# Patient Record
Sex: Male | Born: 1950 | Race: White | Hispanic: No | Marital: Single | Smoking: Former smoker
Health system: Southern US, Community
[De-identification: ages and names within clinical notes are randomized; demographics above are authoritative.]

## PROBLEM LIST (undated history)

## (undated) DIAGNOSIS — J45909 Unspecified asthma, uncomplicated: Secondary | ICD-10-CM

## (undated) DIAGNOSIS — J449 Chronic obstructive pulmonary disease, unspecified: Secondary | ICD-10-CM

## (undated) DIAGNOSIS — F411 Generalized anxiety disorder: Secondary | ICD-10-CM

## (undated) DIAGNOSIS — Z972 Presence of dental prosthetic device (complete) (partial): Secondary | ICD-10-CM

## (undated) DIAGNOSIS — K449 Diaphragmatic hernia without obstruction or gangrene: Secondary | ICD-10-CM

## (undated) DIAGNOSIS — I10 Essential (primary) hypertension: Secondary | ICD-10-CM

## (undated) DIAGNOSIS — J302 Other seasonal allergic rhinitis: Secondary | ICD-10-CM

---

## 2009-01-11 ENCOUNTER — Inpatient Hospital Stay: Payer: Self-pay | Admitting: Internal Medicine

## 2009-02-01 ENCOUNTER — Inpatient Hospital Stay: Payer: Self-pay | Admitting: Internal Medicine

## 2009-05-26 ENCOUNTER — Ambulatory Visit: Payer: Self-pay | Admitting: Specialist

## 2009-06-22 ENCOUNTER — Ambulatory Visit: Payer: Self-pay | Admitting: Specialist

## 2009-07-27 ENCOUNTER — Emergency Department: Payer: Self-pay | Admitting: Emergency Medicine

## 2010-05-05 ENCOUNTER — Ambulatory Visit: Payer: Self-pay | Admitting: Specialist

## 2014-09-20 ENCOUNTER — Ambulatory Visit: Payer: Self-pay | Admitting: Gastroenterology

## 2014-12-14 ENCOUNTER — Encounter: Payer: Self-pay | Admitting: Family Medicine

## 2014-12-14 DIAGNOSIS — F411 Generalized anxiety disorder: Secondary | ICD-10-CM | POA: Insufficient documentation

## 2014-12-14 DIAGNOSIS — K449 Diaphragmatic hernia without obstruction or gangrene: Secondary | ICD-10-CM | POA: Insufficient documentation

## 2014-12-14 DIAGNOSIS — K219 Gastro-esophageal reflux disease without esophagitis: Secondary | ICD-10-CM | POA: Insufficient documentation

## 2014-12-14 DIAGNOSIS — J302 Other seasonal allergic rhinitis: Secondary | ICD-10-CM

## 2014-12-14 DIAGNOSIS — Z800 Family history of malignant neoplasm of digestive organs: Secondary | ICD-10-CM | POA: Insufficient documentation

## 2014-12-14 DIAGNOSIS — J449 Chronic obstructive pulmonary disease, unspecified: Secondary | ICD-10-CM | POA: Insufficient documentation

## 2014-12-14 DIAGNOSIS — F1320 Sedative, hypnotic or anxiolytic dependence, uncomplicated: Secondary | ICD-10-CM | POA: Insufficient documentation

## 2014-12-14 DIAGNOSIS — I10 Essential (primary) hypertension: Secondary | ICD-10-CM | POA: Insufficient documentation

## 2014-12-14 HISTORY — DX: Other seasonal allergic rhinitis: J30.2

## 2014-12-14 HISTORY — DX: Diaphragmatic hernia without obstruction or gangrene: K44.9

## 2014-12-14 HISTORY — DX: Generalized anxiety disorder: F41.1

## 2015-02-01 DIAGNOSIS — F419 Anxiety disorder, unspecified: Secondary | ICD-10-CM | POA: Insufficient documentation

## 2015-02-25 DIAGNOSIS — L309 Dermatitis, unspecified: Secondary | ICD-10-CM | POA: Insufficient documentation

## 2015-06-01 ENCOUNTER — Other Ambulatory Visit: Payer: Self-pay

## 2015-06-01 DIAGNOSIS — K219 Gastro-esophageal reflux disease without esophagitis: Secondary | ICD-10-CM

## 2015-06-01 MED ORDER — OMEPRAZOLE 20 MG PO CPDR: 20.0000 mg | DELAYED_RELEASE_CAPSULE | Freq: Every day | ORAL | Status: DC

## 2016-01-20 ENCOUNTER — Ambulatory Visit: Payer: Self-pay | Admitting: Family Medicine

## 2016-12-26 ENCOUNTER — Other Ambulatory Visit: Payer: Self-pay | Admitting: Otolaryngology

## 2016-12-26 DIAGNOSIS — J3489 Other specified disorders of nose and nasal sinuses: Secondary | ICD-10-CM

## 2016-12-28 ENCOUNTER — Ambulatory Visit
Admission: RE | Admit: 2016-12-28 | Discharge: 2016-12-28 | Disposition: A | Discharge status: Home / Self Care | Payer: Medicare Other | Source: Ambulatory Visit | Attending: Otolaryngology | Admitting: Otolaryngology

## 2016-12-28 DIAGNOSIS — J329 Chronic sinusitis, unspecified: Secondary | ICD-10-CM | POA: Diagnosis not present

## 2016-12-28 DIAGNOSIS — R220 Localized swelling, mass and lump, head: Secondary | ICD-10-CM | POA: Insufficient documentation

## 2016-12-28 DIAGNOSIS — J3489 Other specified disorders of nose and nasal sinuses: Secondary | ICD-10-CM

## 2016-12-28 HISTORY — DX: Unspecified asthma, uncomplicated: J45.909

## 2016-12-28 LAB — POCT I-STAT CREATININE: CREATININE: 0.8 mg/dL (ref 0.61–1.24)

## 2016-12-28 MED ORDER — IOPAMIDOL (ISOVUE-300) INJECTION 61%
75.0000 mL | Freq: Once | INTRAVENOUS | Status: AC | PRN
  Administered 2016-12-28: 75 mL via INTRAVENOUS

## 2017-01-15 DIAGNOSIS — C300 Malignant neoplasm of nasal cavity: Secondary | ICD-10-CM | POA: Insufficient documentation

## 2017-05-01 ENCOUNTER — Ambulatory Visit: Payer: Medicare Other | Admitting: Anesthesiology

## 2017-05-01 ENCOUNTER — Inpatient Hospital Stay: Payer: Medicare Other

## 2017-05-01 ENCOUNTER — Encounter: Payer: Self-pay | Admitting: *Deleted

## 2017-05-01 ENCOUNTER — Emergency Department: Payer: Medicare Other

## 2017-05-01 ENCOUNTER — Inpatient Hospital Stay
Admission: EM | Admit: 2017-05-01 | Discharge: 2017-05-03 | DRG: 394 | Disposition: A | Discharge status: Home / Self Care | Payer: Medicare Other | Attending: Internal Medicine | Admitting: Internal Medicine

## 2017-05-01 ENCOUNTER — Encounter
Admission: EM | Disposition: A | Discharge status: Home / Self Care | Payer: Self-pay | Source: Home / Self Care | Attending: Internal Medicine

## 2017-05-01 DIAGNOSIS — J449 Chronic obstructive pulmonary disease, unspecified: Secondary | ICD-10-CM | POA: Diagnosis present

## 2017-05-01 DIAGNOSIS — J45909 Unspecified asthma, uncomplicated: Secondary | ICD-10-CM | POA: Diagnosis present

## 2017-05-01 DIAGNOSIS — F418 Other specified anxiety disorders: Secondary | ICD-10-CM | POA: Diagnosis present

## 2017-05-01 DIAGNOSIS — K222 Esophageal obstruction: Secondary | ICD-10-CM | POA: Diagnosis present

## 2017-05-01 DIAGNOSIS — K210 Gastro-esophageal reflux disease with esophagitis: Secondary | ICD-10-CM | POA: Diagnosis present

## 2017-05-01 DIAGNOSIS — T18128A Food in esophagus causing other injury, initial encounter: Principal | ICD-10-CM | POA: Diagnosis present

## 2017-05-01 DIAGNOSIS — K922 Gastrointestinal hemorrhage, unspecified: Secondary | ICD-10-CM | POA: Diagnosis present

## 2017-05-01 DIAGNOSIS — D649 Anemia, unspecified: Secondary | ICD-10-CM | POA: Diagnosis present

## 2017-05-01 DIAGNOSIS — K449 Diaphragmatic hernia without obstruction or gangrene: Secondary | ICD-10-CM | POA: Diagnosis present

## 2017-05-01 DIAGNOSIS — I10 Essential (primary) hypertension: Secondary | ICD-10-CM | POA: Diagnosis present

## 2017-05-01 DIAGNOSIS — R1314 Dysphagia, pharyngoesophageal phase: Secondary | ICD-10-CM | POA: Diagnosis present

## 2017-05-01 DIAGNOSIS — I959 Hypotension, unspecified: Secondary | ICD-10-CM | POA: Diagnosis present

## 2017-05-01 DIAGNOSIS — Z7951 Long term (current) use of inhaled steroids: Secondary | ICD-10-CM | POA: Diagnosis not present

## 2017-05-01 DIAGNOSIS — K920 Hematemesis: Secondary | ICD-10-CM | POA: Diagnosis not present

## 2017-05-01 DIAGNOSIS — Z79899 Other long term (current) drug therapy: Secondary | ICD-10-CM

## 2017-05-01 DIAGNOSIS — W44F3XA Food entering into or through a natural orifice, initial encounter: Secondary | ICD-10-CM

## 2017-05-01 HISTORY — DX: Essential (primary) hypertension: I10

## 2017-05-01 HISTORY — PX: ESOPHAGOGASTRODUODENOSCOPY: SHX5428

## 2017-05-01 LAB — CBC WITH DIFFERENTIAL/PLATELET
BASOS ABS: 0 10*3/uL (ref 0–0.1)
BASOS PCT: 0 %
EOS ABS: 0 10*3/uL (ref 0–0.7)
EOS PCT: 0 %
HCT: 40.5 % (ref 40.0–52.0)
Hemoglobin: 14.3 g/dL (ref 13.0–18.0)
Lymphocytes Relative: 2 %
Lymphs Abs: 0.2 10*3/uL — ABNORMAL LOW (ref 1.0–3.6)
MCH: 30.2 pg (ref 26.0–34.0)
MCHC: 35.2 g/dL (ref 32.0–36.0)
MCV: 85.7 fL (ref 80.0–100.0)
MONO ABS: 0.5 10*3/uL (ref 0.2–1.0)
Monocytes Relative: 6 %
Neutro Abs: 7.1 10*3/uL — ABNORMAL HIGH (ref 1.4–6.5)
Neutrophils Relative %: 92 %
PLATELETS: 227 10*3/uL (ref 150–440)
RBC: 4.73 MIL/uL (ref 4.40–5.90)
RDW: 13.9 % (ref 11.5–14.5)
WBC: 7.8 10*3/uL (ref 3.8–10.6)

## 2017-05-01 LAB — BASIC METABOLIC PANEL
ANION GAP: 11 (ref 5–15)
BUN: 7 mg/dL (ref 6–20)
CALCIUM: 9.6 mg/dL (ref 8.9–10.3)
CO2: 28 mmol/L (ref 22–32)
Chloride: 95 mmol/L — ABNORMAL LOW (ref 101–111)
Creatinine, Ser: 0.65 mg/dL (ref 0.61–1.24)
GFR calc Af Amer: >60 mL/min (ref >60)
Glucose, Bld: 98 mg/dL (ref 65–99)
POTASSIUM: 4 mmol/L (ref 3.5–5.1)
SODIUM: 134 mmol/L — AB (ref 135–145)

## 2017-05-01 LAB — MRSA PCR SCREENING: MRSA by PCR: NEGATIVE

## 2017-05-01 LAB — HEMOGLOBIN AND HEMATOCRIT, BLOOD
HCT: 41.5 % (ref 40.0–52.0)
Hemoglobin: 14.2 g/dL (ref 13.0–18.0)

## 2017-05-01 LAB — GLUCOSE, CAPILLARY: GLUCOSE-CAPILLARY: 90 mg/dL (ref 65–99)

## 2017-05-01 SURGERY — EGD (ESOPHAGOGASTRODUODENOSCOPY)
Anesthesia: General

## 2017-05-01 MED ORDER — SUCCINYLCHOLINE CHLORIDE 20 MG/ML IJ SOLN
INTRAMUSCULAR | Status: DC | PRN
  Administered 2017-05-01: 100 mg via INTRAVENOUS

## 2017-05-01 MED ORDER — MIDAZOLAM HCL 2 MG/2ML IJ SOLN
INTRAMUSCULAR | Status: DC | PRN
  Administered 2017-05-01 (×2): 2 mg via INTRAVENOUS

## 2017-05-01 MED ORDER — GLUCAGON HCL (RDNA) 1 MG IJ SOLR
1.0000 mg | Freq: Once | INTRAMUSCULAR | Status: AC
  Administered 2017-05-01: 1 mg via INTRAVENOUS
  Filled 2017-05-01 (×2): qty 1

## 2017-05-01 MED ORDER — LIDOCAINE HCL (PF) 2 % IJ SOLN
INTRAMUSCULAR | Status: AC
  Filled 2017-05-01: qty 2

## 2017-05-01 MED ORDER — PANTOPRAZOLE SODIUM 40 MG IV SOLR: 40.0000 mg | Freq: Two times a day (BID) | INTRAVENOUS | Status: DC

## 2017-05-01 MED ORDER — SODIUM CHLORIDE 0.9 % IV SOLN
8.0000 mg/h | INTRAVENOUS | Status: DC
  Administered 2017-05-01 – 2017-05-03 (×3): 8 mg/h via INTRAVENOUS
  Filled 2017-05-01 (×4): qty 80

## 2017-05-01 MED ORDER — SUCCINYLCHOLINE CHLORIDE 20 MG/ML IJ SOLN
INTRAMUSCULAR | Status: AC
  Filled 2017-05-01: qty 1

## 2017-05-01 MED ORDER — ONDANSETRON HCL 4 MG/2ML IJ SOLN
4.0000 mg | Freq: Once | INTRAMUSCULAR | Status: AC
  Administered 2017-05-01: 4 mg via INTRAVENOUS
  Filled 2017-05-01: qty 2

## 2017-05-01 MED ORDER — FENTANYL CITRATE (PF) 100 MCG/2ML IJ SOLN: 25.0000 ug | INTRAMUSCULAR | Status: DC | PRN

## 2017-05-01 MED ORDER — ONDANSETRON HCL 4 MG/2ML IJ SOLN
INTRAMUSCULAR | Status: DC | PRN
  Administered 2017-05-01: 4 mg via INTRAVENOUS

## 2017-05-01 MED ORDER — LIDOCAINE HCL (CARDIAC) 20 MG/ML IV SOLN
INTRAVENOUS | Status: DC | PRN
  Administered 2017-05-01: 40 mg via INTRATRACHEAL

## 2017-05-01 MED ORDER — DEXAMETHASONE SODIUM PHOSPHATE 10 MG/ML IJ SOLN
INTRAMUSCULAR | Status: AC
  Filled 2017-05-01: qty 1

## 2017-05-01 MED ORDER — ONDANSETRON HCL 4 MG/2ML IJ SOLN: 4.0000 mg | Freq: Once | INTRAMUSCULAR | Status: DC | PRN

## 2017-05-01 MED ORDER — SODIUM CHLORIDE 0.9 % IV SOLN
INTRAVENOUS | Status: DC
  Administered 2017-05-01: 19:00:00 via INTRAVENOUS
  Administered 2017-05-02: 100 mL/h via INTRAVENOUS

## 2017-05-01 MED ORDER — MIDAZOLAM HCL 2 MG/2ML IJ SOLN
INTRAMUSCULAR | Status: AC
  Filled 2017-05-01: qty 2

## 2017-05-01 MED ORDER — SUGAMMADEX SODIUM 200 MG/2ML IV SOLN
INTRAVENOUS | Status: AC
  Filled 2017-05-01: qty 2

## 2017-05-01 MED ORDER — ROCURONIUM BROMIDE 50 MG/5ML IV SOLN
INTRAVENOUS | Status: AC
  Filled 2017-05-01: qty 1

## 2017-05-01 MED ORDER — GLUCAGON HCL RDNA (DIAGNOSTIC) 1 MG IJ SOLR
INTRAMUSCULAR | Status: AC
  Administered 2017-05-01: 1 mg via INTRAVENOUS
  Filled 2017-05-01: qty 1

## 2017-05-01 MED ORDER — DEXAMETHASONE SODIUM PHOSPHATE 10 MG/ML IJ SOLN
INTRAMUSCULAR | Status: DC | PRN
  Administered 2017-05-01: 10 mg via INTRAVENOUS

## 2017-05-01 MED ORDER — SUGAMMADEX SODIUM 200 MG/2ML IV SOLN
INTRAVENOUS | Status: DC | PRN
  Administered 2017-05-01: 150 mg via INTRAVENOUS

## 2017-05-01 MED ORDER — SODIUM CHLORIDE 0.9 % IV SOLN
INTRAVENOUS | Status: DC
  Administered 2017-05-01 (×2): via INTRAVENOUS

## 2017-05-01 MED ORDER — SODIUM CHLORIDE 0.9 % IV BOLUS (SEPSIS)
500.0000 mL | Freq: Once | INTRAVENOUS | Status: AC
  Administered 2017-05-01: 500 mL via INTRAVENOUS

## 2017-05-01 MED ORDER — ONDANSETRON HCL 4 MG/2ML IJ SOLN
INTRAMUSCULAR | Status: AC
  Filled 2017-05-01: qty 2

## 2017-05-01 MED ORDER — FENTANYL CITRATE (PF) 100 MCG/2ML IJ SOLN
INTRAMUSCULAR | Status: DC | PRN
  Administered 2017-05-01: 100 ug via INTRAVENOUS

## 2017-05-01 MED ORDER — SODIUM CHLORIDE 0.9 % IV SOLN
80.0000 mg | Freq: Once | INTRAVENOUS | Status: AC
  Administered 2017-05-01: 19:00:00 80 mg via INTRAVENOUS
  Filled 2017-05-01: qty 80

## 2017-05-01 MED ORDER — FENTANYL CITRATE (PF) 100 MCG/2ML IJ SOLN
25.0000 ug | INTRAMUSCULAR | Status: DC | PRN
  Administered 2017-05-01: 25 ug via INTRAVENOUS
  Filled 2017-05-01: qty 2

## 2017-05-01 MED ORDER — PROPOFOL 10 MG/ML IV BOLUS
INTRAVENOUS | Status: DC | PRN
  Administered 2017-05-01: 50 mg via INTRAVENOUS
  Administered 2017-05-01: 150 mg via INTRAVENOUS

## 2017-05-01 MED ORDER — FENTANYL CITRATE (PF) 100 MCG/2ML IJ SOLN
INTRAMUSCULAR | Status: AC
  Filled 2017-05-01: qty 2

## 2017-05-01 MED ORDER — ROCURONIUM BROMIDE 100 MG/10ML IV SOLN
INTRAVENOUS | Status: DC | PRN
  Administered 2017-05-01 (×2): 20 mg via INTRAVENOUS

## 2017-05-01 MED ORDER — PROPOFOL 10 MG/ML IV BOLUS
INTRAVENOUS | Status: AC
  Filled 2017-05-01: qty 20

## 2017-05-01 NOTE — Anesthesia Post-op Follow-up Note (Signed)
Anesthesia QCDR form completed.        

## 2017-05-01 NOTE — Anesthesia Postprocedure Evaluation (Signed)
Anesthesia Post Note  Patient: Aaron Walton  Procedure(s) Performed: Procedure(s) (LRB): ESOPHAGOGASTRODUODENOSCOPY (EGD) (N/A)  Patient location during evaluation: PACU Anesthesia Type: General Level of consciousness: awake and alert Pain management: pain level controlled Vital Signs Assessment: post-procedure vital signs reviewed and stable Respiratory status: spontaneous breathing, nonlabored ventilation, respiratory function stable and patient connected to nasal cannula oxygen Cardiovascular status: blood pressure returned to baseline and stable Postop Assessment: no signs of nausea or vomiting Anesthetic complications: no     Last Vitals:  Vitals:   05/01/17 1900 05/01/17 2000  BP:    Pulse: 72 79  Resp: 13 13  Temp:  36.9 C  SpO2: 99% 98%    Last Pain:  Vitals:   05/01/17 2000  TempSrc: Oral  PainSc: 0-No pain                 Martha Clan

## 2017-05-01 NOTE — ED Triage Notes (Signed)
States while eating ham for lunch he got a piece stuck in his throat, states some burning in his throat and coughing up some blood, states he can not swallow water, states hx of esophogeal stretching, pt in no acute distress

## 2017-05-01 NOTE — H&P (Signed)
Aaron Walton is an 66 y.o. male.   Chief Complaint: choking HPI: this is 66 year old male who has a history of esophageal stricture in the past. Today he was eating some ham and got a piece stuck in his throat. He came into the ER was taking to endoscopy where the food obstruction was removed esophagus was dilated some. During the procedure the GI hysician notice some blood oozing from the wall of esophagitis and a clot. Concerned that point was possible tear versus perforation of the esophagus. Hospitalist services were consulted for admission. Patient was to get a CT scan to rule this out. Currently the patient is extubated and has no complaints right now said he feels a lot better. No chest pain no shortness of breath.  Past Medical History:  Diagnosis Date  . Asthma   . Hypertension     History reviewed. No pertinent surgical history.  History reviewed. No pertinent family history. Social History:  reports that he has never smoked. He has never used smokeless tobacco. He reports that he does not drink alcohol or use drugs.  Allergies: No Known Allergies  Medications Prior to Admission  Medication Sig Dispense Refill  . ALPRAZolam (XANAX) 1 MG tablet Take 1 tablet by mouth 3 (three) times daily as needed.    . clonazePAM (KLONOPIN) 1 MG tablet Take 1 mg by mouth 2 (two) times daily.    . Fluticasone-Salmeterol (ADVAIR) 250-50 MCG/DOSE AEPB Inhale 2 puffs into the lungs 2 (two) times daily.    Marland Kitchen omeprazole (PRILOSEC) 20 MG capsule Take 1 capsule (20 mg total) by mouth at bedtime. 30 capsule 11  . quinapril-hydrochlorothiazide (ACCURETIC) 20-12.5 MG per tablet Take 2 tablets by mouth daily.    . sertraline (ZOLOFT) 50 MG tablet Take 50 mg by mouth daily.    Marland Kitchen tiotropium (SPIRIVA) 18 MCG inhalation capsule Place 1 capsule into inhaler and inhale daily.    Marland Kitchen albuterol (PROVENTIL HFA;VENTOLIN HFA) 108 (90 BASE) MCG/ACT inhaler Inhale 1 puff into the lungs every 6 (six) hours as needed.     . cetirizine (ZYRTEC) 10 MG tablet Take 1 tablet by mouth daily.    . furosemide (LASIX) 20 MG tablet Take 1 tablet by mouth daily.      Results for orders placed or performed during the hospital encounter of 05/01/17 (from the past 48 hour(s))  CBC with Differential/Platelet     Status: Abnormal   Collection Time: 05/01/17  2:24 PM  Result Value Ref Range   WBC 7.8 3.8 - 10.6 K/uL   RBC 4.73 4.40 - 5.90 MIL/uL   Hemoglobin 14.3 13.0 - 18.0 g/dL   HCT 40.5 40.0 - 52.0 %   MCV 85.7 80.0 - 100.0 fL   MCH 30.2 26.0 - 34.0 pg   MCHC 35.2 32.0 - 36.0 g/dL   RDW 13.9 11.5 - 14.5 %   Platelets 227 150 - 440 K/uL   Neutrophils Relative % 92 %   Neutro Abs 7.1 (H) 1.4 - 6.5 K/uL   Lymphocytes Relative 2 %   Lymphs Abs 0.2 (L) 1.0 - 3.6 K/uL   Monocytes Relative 6 %   Monocytes Absolute 0.5 0.2 - 1.0 K/uL   Eosinophils Relative 0 %   Eosinophils Absolute 0.0 0 - 0.7 K/uL   Basophils Relative 0 %   Basophils Absolute 0.0 0 - 0.1 K/uL  Basic metabolic panel     Status: Abnormal   Collection Time: 05/01/17  2:24 PM  Result Value Ref  Range   Sodium 134 (L) 135 - 145 mmol/L   Potassium 4.0 3.5 - 5.1 mmol/L   Chloride 95 (L) 101 - 111 mmol/L   CO2 28 22 - 32 mmol/L   Glucose, Bld 98 65 - 99 mg/dL   BUN 7 6 - 20 mg/dL   Creatinine, Ser 0.65 0.61 - 1.24 mg/dL   Calcium 9.6 8.9 - 10.3 mg/dL   GFR calc non Af Amer >60 >60 mL/min   GFR calc Af Amer >60 >60 mL/min    Comment: (NOTE) The eGFR has been calculated using the CKD EPI equation. This calculation has not been validated in all clinical situations. eGFR's persistently <60 mL/min signify possible Chronic Kidney Disease.    Anion gap 11 5 - 15   Dg Chest Portable 1 View  Result Date: 05/01/2017 CLINICAL DATA:  Cough, throat burning after getting ham stuck in throat during lunch. CT cannot swallow. History of esophageal stretching. EXAM: PORTABLE CHEST 1 VIEW COMPARISON:  CT chest May 05, 2010 FINDINGS: Cardiac silhouette is  normal in size. Mildly tortuous aorta associated with hypertension. No pleural effusion or focal consolidation. No radiopaque foreign bodies. No pneumothorax. Old RIGHT rib fractures. Moderate hiatal hernia. IMPRESSION: No acute cardiopulmonary process. Moderate hiatal hernia. Electronically Signed   By: Elon Alas M.D.   On: 05/01/2017 14:58    Review of Systems  Constitutional: Negative for fever.  HENT: Negative for hearing loss.   Eyes: Negative for blurred vision.  Respiratory: Negative for cough.   Cardiovascular: Negative for chest pain.  Gastrointestinal: Positive for heartburn.  Genitourinary: Negative for dysuria.  Musculoskeletal: Negative for myalgias.  Skin: Negative for rash.  Neurological: Negative for dizziness.    Blood pressure 135/78, pulse 77, temperature 98.5 F (36.9 C), resp. rate 12, height 6' (1.829 m), weight 78.5 kg (173 lb), SpO2 99 %. Physical Exam  Constitutional: He is oriented to person, place, and time. He appears well-developed and well-nourished. No distress.  HENT:  Head: Normocephalic and atraumatic.  Mouth/Throat: Oropharynx is clear and moist. No oropharyngeal exudate.  Eyes: Pupils are equal, round, and reactive to light. No scleral icterus.  Neck: Normal range of motion. Neck supple. No JVD present. No thyromegaly present.  Cardiovascular: Normal rate and regular rhythm.   No murmur heard. Respiratory: Effort normal and breath sounds normal. No respiratory distress. He exhibits no tenderness.  GI: Soft. Bowel sounds are normal. He exhibits no distension and no mass.  Musculoskeletal: He exhibits no edema or tenderness.  Neurological: He is alert and oriented to person, place, and time. No cranial nerve deficit.  Skin: Skin is warm and dry.     Assessment/Plan 1.GI bleeding. Since upper GI from likely a tear in the esophagus.based on his symptoms and physical exam currently have lower suspicion of esophageal perforation. However CT  scan with contrast is pending to rule this out totally.We'll go ahead and put him on a PPI. Recheck hemoglobin. If CT comes back showing a tear then will get a stat surgical consult at that point. For now he is going to the stepdown unit for close observation. 2. Esophageal stricture. He status post dilatation. He also had a food obstruction which has been removed. 3. COPD. We'll continue his inhalers. 4. Hypertension. We'll restart his medicines when he is able to take by mouth.  Total time spent 45 minutes  Baxter Hire, MD 05/01/2017, 6:31 PM

## 2017-05-01 NOTE — Consult Note (Signed)
Name: Aaron Walton MRN: [id number] DOB: [birth year]    ADMISSION DATE:  05/01/2017 CONSULTATION DATE: 05/01/2017  REFERRING MD : Dr. Edwina Barth   CHIEF COMPLAINT: s/p EGD due to food impaction   BRIEF PATIENT DESCRIPTION:  66 yo old male admitted 09/5 s/p EGD due to food impaction extubated post EGD concern for possible esophageal perforation   SIGNIFICANT EVENTS  09/5-Pt admitted to stepdown unit s/p EGD  STUDIES:  Upper Endoscopy 09/5>>Food at the gastroesophageal junction. Removal was successful. GE junction stricture Mucosal tear in the lower third of the esophagus. Normal stomach. Normal duodenal bulb, second portion of the duodenum and third portion of the duodenum.  HISTORY OF PRESENT ILLNESS:   This is a 66 yo male with a PMH of HTN, Esophageal Stretching, and Asthma.  He presented to [facility] ER 09/5 from home with c/o burning of the throat, difficulty swallowing water, and coughing up blood after getting choked during lunch while eating ham.  Per GI notes he had a similar episode 2-3 years ago and underwent an EGD followed by dilation.  Over the past several months he has had intermittent episodes with difficulty swallowing solids.  Due to symptoms gastroenterology consulted and recommended the pt receive an upper endoscopy.  The pt was mechanically intubated prior to upper endoscopy.  EGD results revealed food at the gastroesophageal junction that was successfully removed during endoscopy and a pre-existing bleeding mucosal tear with overlying blood clot was present in the lower third of the esophagus without obvious perforation.  The pt was successfully extubated post intubation and admitted by hospitalist team to the stepdown unit for further workup and treatment PCCM consulted.  PAST MEDICAL HISTORY :   has a past medical history of Asthma and Hypertension.  has no past surgical history on file. Prior to Admission medications   Medication Sig Start Date End Date Taking?  Authorizing Provider  ALPRAZolam Duanne Moron) 1 MG tablet Take 1 tablet by mouth 3 (three) times daily as needed.   Yes [provider]  clonazePAM (KLONOPIN) 1 MG tablet Take 1 mg by mouth 2 (two) times daily.   Yes [provider]  Fluticasone-Salmeterol (ADVAIR) 250-50 MCG/DOSE AEPB Inhale 2 puffs into the lungs 2 (two) times daily.   Yes [provider]  omeprazole (PRILOSEC) 20 MG capsule Take 1 capsule (20 mg total) by mouth at bedtime. 06/01/15  Yes Lucilla Lame, MD  quinapril-hydrochlorothiazide (ACCURETIC) 20-12.5 MG per tablet Take 2 tablets by mouth daily.   Yes [provider]  sertraline (ZOLOFT) 50 MG tablet Take 50 mg by mouth daily.   Yes [provider]  tiotropium (SPIRIVA) 18 MCG inhalation capsule Place 1 capsule into inhaler and inhale daily.   Yes [provider]  albuterol (PROVENTIL HFA;VENTOLIN HFA) 108 (90 BASE) MCG/ACT inhaler Inhale 1 puff into the lungs every 6 (six) hours as needed.    [provider]  cetirizine (ZYRTEC) 10 MG tablet Take 1 tablet by mouth daily.    [provider]  furosemide (LASIX) 20 MG tablet Take 1 tablet by mouth daily.    [provider]   No Known Allergies  FAMILY HISTORY:  family history is not on file. SOCIAL HISTORY:  reports that he has never smoked. He has never used smokeless tobacco. He reports that he does not drink alcohol or use drugs.  REVIEW OF SYSTEMS: Positives in BOLD  Constitutional: Negative for fever, chills, weight loss, malaise/fatigue and diaphoresis.  HENT: hearing loss, ear pain,  nosebleeds, congestion, sore throat, neck pain, tinnitus and ear discharge.   Eyes: Negative for blurred vision, double vision, photophobia, pain, discharge and redness.  Respiratory: Negative for cough, hemoptysis, sputum production, shortness of breath, wheezing and stridor.   Cardiovascular: Negative for chest pain, palpitations, orthopnea, claudication, leg  swelling and PND.  Gastrointestinal: heartburn, nausea, vomiting, abdominal pain, diarrhea, constipation, blood in stool and melena.  Genitourinary: Negative for dysuria, urgency, frequency, hematuria and flank pain.  Musculoskeletal: Negative for myalgias, back pain, joint pain and falls.  Skin: Negative for itching and rash.  Neurological: Negative for dizziness, tingling, tremors, sensory change, speech change, focal weakness, seizures, loss of consciousness, weakness and headaches.  Endo/Heme/Allergies: Negative for environmental allergies and polydipsia. Does not bruise/bleed easily.  SUBJECTIVE:  No complaints at this time.  VITAL SIGNS: Temp:  [98 F (36.7 C)-98.7 F (37.1 C)] 98 F (36.7 C) (09/05 1745) Pulse Rate:  [77-95] 84 (09/05 1800) Resp:  [11-18] 11 (09/05 1800) BP: (131-147)/(77-98) 131/77 (09/05 1800) SpO2:  [98 %-100 %] 100 % (09/05 1800) Weight:  [78.5 kg (173 lb)] 78.5 kg (173 lb) (09/05 1545)  PHYSICAL EXAMINATION: General: well developed, well nourished, Caucasian male, NAD Neuro: alert and oriented, follows commands  HEENT: supple, no JVD Cardiovascular: s1s2, RRR, no M/R/G Lungs: clear throughout, even, non labored  Abdomen: +BS x4, soft, non tender, non distended  Musculoskeletal: normal bulk and tone, no edema  Skin: intact no rashes or lesions    Recent Labs Lab 05/01/17 1424  NA 134*  K 4.0  CL 95*  CO2 28  BUN 7  CREATININE 0.65  GLUCOSE 98    Recent Labs Lab 05/01/17 1424  HGB 14.3  HCT 40.5  WBC 7.8  PLT 227   Dg Chest Portable 1 View  Result Date: 05/01/2017 CLINICAL DATA:  Cough, throat burning after getting ham stuck in throat during lunch. CT cannot swallow. History of esophageal stretching. EXAM: PORTABLE CHEST 1 VIEW COMPARISON:  CT chest May 05, 2010 FINDINGS: Cardiac silhouette is normal in size. Mildly tortuous aorta associated with hypertension. No pleural effusion or focal consolidation. No radiopaque foreign  bodies. No pneumothorax. Old RIGHT rib fractures. Moderate hiatal hernia. IMPRESSION: No acute cardiopulmonary process. Moderate hiatal hernia. Electronically Signed   By: Elon Alas M.D.   On: 05/01/2017 14:58    ASSESSMENT / PLAN: s/p EGD due to food impaction in the gastroesophageal junction  Bleeding in the oropharynx and entire esophagus secondary to food impaction Hx: Asthma and HTN P: Supplemental O2 to maintain O2 sats >92% or for dyspnea  Prn CXR CT Chest pending to assess for possible esophageal perforation  Gastroenterology consulted appreciate input Serial H&H for the next 24hrs Monitor for s/sx of bleeding Transfuse for active bleeding and/or hgb <7 SCD's for VTE prophylaxis, avoid chemical prophylaxis  NS@ 100 ml/hr  Maintain map >65 Aspiration precautions  Prn zofran for nausea/vomiting   Marda Stalker, [place] Pager [phone] (please enter 7 digits) PCCM Consult Pager [phone] (please enter 7 digits)

## 2017-05-01 NOTE — Progress Notes (Signed)
eLink Physician-Brief Progress Note Patient Name: Aaron Walton DOB: [birth year] MRN: [id number]   Date of Service  05/01/2017  HPI/Events of Note  66 yo male with PMH of COPD, HTN and esophageal food impaction. Now s/p EGD for esophageal food impaction. Remains intubated and ventilated. Concern for esophageal perforation. CT Scan pending. PCCM consulted to assume care in ICU. VSS.   eICU Interventions  No new orders.      Intervention Category Evaluation Type: New Patient Evaluation  Lysle Dingwall 05/01/2017, 6:55 PM

## 2017-05-01 NOTE — Anesthesia Preprocedure Evaluation (Addendum)
Anesthesia Evaluation  Patient identified by MRN, date of birth, ID band Patient awake    Reviewed: Allergy & Precautions, H&P , NPO status , Patient's Chart, lab work & pertinent test results, reviewed documented beta blocker date and time   History of Anesthesia Complications Negative for: history of anesthetic complications  Airway Mallampati: I  TM Distance: >3 FB Neck ROM: full    Dental  (+) Edentulous Upper, Upper Dentures, Partial Lower, Missing, Dental Advidsory Given   Pulmonary neg shortness of breath, asthma , neg sleep apnea, COPD,  COPD inhaler, neg recent URI,           Cardiovascular Exercise Tolerance: Good hypertension, (-) angina(-) CAD, (-) Past MI, (-) Cardiac Stents and (-) CABG (-) dysrhythmias (-) Valvular Problems/Murmurs     Neuro/Psych PSYCHIATRIC DISORDERS (Anxiety) negative neurological ROS     GI/Hepatic Neg liver ROS, hiatal hernia, GERD  ,  Endo/Other  negative endocrine ROS  Renal/GU negative Renal ROS  negative genitourinary   Musculoskeletal   Abdominal   Peds  Hematology negative hematology ROS (+)   Anesthesia Other Findings Past Medical History: No date: Asthma No date: Hypertension   Reproductive/Obstetrics negative OB ROS                             Anesthesia Physical Anesthesia Plan  ASA: II  Anesthesia Plan: General   Post-op Pain Management:    Induction: Intravenous, Rapid sequence and Cricoid pressure planned  PONV Risk Score and Plan: 2 and Ondansetron and Dexamethasone  Airway Management Planned: Oral ETT  Additional Equipment:   Intra-op Plan:   Post-operative Plan: Extubation in OR  Informed Consent: I have reviewed the patients History and Physical, chart, labs and discussed the procedure including the risks, benefits and alternatives for the proposed anesthesia with the patient or authorized representative who has indicated  his/her understanding and acceptance.   Dental Advisory Given  Plan Discussed with: Anesthesiologist, CRNA and Surgeon  Anesthesia Plan Comments:        Anesthesia Quick Evaluation

## 2017-05-01 NOTE — Op Note (Signed)
[facility] Gastroenterology Patient Name: Aaron Walton Procedure Date: 05/01/2017 4:29 PM MRN: [id number] Account #: [id number] Date of Birth: [birth year] Admit Type: Emergency Department Age: 66 Room: [facility] ENDO ROOM 3 Gender: Male Note Status: Finalized Procedure:            Upper GI endoscopy Indications:          Foreign body in the esophagus, Esophageal dysphagia Providers:            Lin Landsman MD, MD Referring MD:         [facility] pratice [place], MD (Referring MD) Medicines:            General Anesthesia Complications:        No immediate complications. Estimated blood loss:                        Minimal. Procedure:            Pre-Anesthesia Assessment:                       - Prior to the procedure, a History and Physical was                        performed, and patient medications and allergies were                        reviewed. The patient is competent. The risks and                        benefits of the procedure and the sedation options and                        risks were discussed with the patient. All questions                        were answered and informed consent was obtained.                        Patient identification and proposed procedure were                        verified by the physician, the nurse, the                        anesthesiologist, the anesthetist and the technician in                        the pre-procedure area in the procedure room. Mental                        Status Examination: alert and oriented. Airway                        Examination: normal oropharyngeal airway and neck                        mobility. Respiratory Examination: clear to                        auscultation. CV Examination: normal. Prophylactic  Antibiotics: The patient does not require prophylactic                        antibiotics. Prior Anticoagulants: The patient has                        taken no  previous anticoagulant or antiplatelet agents.                        ASA Grade Assessment: II - A patient with mild systemic                        disease. After reviewing the risks and benefits, the                        patient was deemed in satisfactory condition to undergo                        the procedure. The anesthesia plan was to use general                        anesthesia. Immediately prior to administration of                        medications, the patient was re-assessed for adequacy                        to receive sedatives. The heart rate, respiratory rate,                        oxygen saturations, blood pressure, adequacy of                        pulmonary ventilation, and response to care were                        monitored throughout the procedure. The physical status                        of the patient was re-assessed after the procedure.                       After obtaining informed consent, the endoscope was                        passed under direct vision. Throughout the procedure,                        the patient's blood pressure, pulse, and oxygen                        saturations were monitored continuously. The Endoscope                        was introduced through the mouth, and advanced to the                        second part of duodenum. The upper GI endoscopy was  accomplished without difficulty. The patient tolerated                        the procedure well. Findings:      Food bolus was found at the gastroesophageal junction. Removal of food       was accomplished.      A bleeding mucosal tear with overlying blood clot that was pre-existing       (pre-procedure) was found in the lower third of the esophagus, probably       secondary to severe retching from food impaction. No obvious perforation       was identified      The entire examined stomach was normal.      The duodenal bulb, second portion of the  duodenum and third portion of       the duodenum were normal.      One severe benign-appearing, intrinsic stenosis was found 38 cm from the       incisors. And scope was traversed after food bolus removal. Impression:           - Food at the gastroesophageal junction. Removal was                        successful.                       - GE junction stricture                       - Mucosal tear in the lower third of the esophagus.                       - Normal stomach.                       - Normal duodenal bulb, second portion of the duodenum                        and third portion of the duodenum. Recommendation:       - Admit the patient to ICU for observation.                       - NPO.                       - Use a proton pump inhibitor IV.                       - Perform CT scan (computed tomography) of the chest                        today to r/o esophageal perforation.                       - Repeat upper endoscopy at appointment to be scheduled                        for retreatment.                       - Return to GI clinic at appointment to be scheduled. Procedure Code(s):    --- Professional ---  [phone], Esophagogastroduodenoscopy, flexible, transoral;                        with removal of foreign body(s) Diagnosis Code(s):    --- Professional ---                       O96.295M, Food in esophagus causing other injury,                        initial encounter                       T18.108A, Unspecified foreign body in esophagus causing                        other injury, initial encounter                       R13.14, Dysphagia, pharyngoesophageal phase CPT copyright 2016 [employer]. All rights reserved. The codes documented in this report are preliminary and upon coder review may  be revised to meet current compliance requirements. Dr. Ulyess Mort Lin Landsman MD, MD 05/01/2017 5:55:58 PM This report has been signed  electronically. Number of Addenda: 0 Note Initiated On: 05/01/2017 4:29 PM      [facility]

## 2017-05-01 NOTE — ED Provider Notes (Signed)
[facility] Emergency Department Provider Note    First MD Initiated Contact with Patient 05/01/17 1428     (approximate)  I have reviewed the triage vital signs and the nursing notes.   HISTORY  Chief Complaint Swallowed Foreign Body    HPI Aaron Walton is a 66 y.o. male with a history of esophageal stricture and food bolus impaction presents withchief complaint of odynophagia started around noon after he was eating ham. Since then he has not been able to tolerate any of water. Denies any shortness of breath is able to speak in complete sentences but has had several episodes of small-volume emesis some with blood in it. Patient is frequent tried drinking water but is unable to keep it down. Denies any other symptoms at this time.   Past Medical History:  Diagnosis Date  . Asthma   . Hypertension    History reviewed. No pertinent family history. History reviewed. No pertinent surgical history. Patient Active Problem List   Diagnosis Date Noted  . GI bleed 05/01/2017  . Food impaction of esophagus   . Benzodiazepine dependence ([place]) 12/14/2014  . COPD, moderate ([place]) 12/14/2014  . Essential (primary) hypertension 12/14/2014  . Family history of colon cancer 12/14/2014  . Gastro-esophageal reflux disease without esophagitis 12/14/2014  . Anxiety, generalized 12/14/2014  . 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 Allergic rhinitis, seasonal 12/14/2014      Prior to Admission medications   Medication Sig Start Date End Date Taking? Authorizing Provider  ALPRAZolam Duanne Moron) 1 MG tablet Take 1 tablet by mouth 3 (three) times daily as needed.   Yes [provider]  clonazePAM (KLONOPIN) 1 MG tablet Take 1 mg by mouth 2 (two) times daily.   Yes [provider]  Fluticasone-Salmeterol (ADVAIR) 250-50 MCG/DOSE AEPB Inhale 2 puffs into the lungs 2 (two) times daily.   Yes [provider]  omeprazole (PRILOSEC) 20 MG capsule Take 1  capsule (20 mg total) by mouth at bedtime. 06/01/15  Yes Lucilla Lame, MD  quinapril-hydrochlorothiazide (ACCURETIC) 20-12.5 MG per tablet Take 2 tablets by mouth daily.   Yes [provider]  sertraline (ZOLOFT) 50 MG tablet Take 50 mg by mouth daily.   Yes [provider]  tiotropium (SPIRIVA) 18 MCG inhalation capsule Place 1 capsule into inhaler and inhale daily.   Yes [provider]  albuterol (PROVENTIL HFA;VENTOLIN HFA) 108 (90 BASE) MCG/ACT inhaler Inhale 1 puff into the lungs every 6 (six) hours as needed.    [provider]  cetirizine (ZYRTEC) 10 MG tablet Take 1 tablet by mouth daily.    [provider]  furosemide (LASIX) 20 MG tablet Take 1 tablet by mouth daily.    [provider]    Allergies Patient has no known allergies.    Social History Social History  Substance Use Topics  . Smoking status: Never Smoker  . Smokeless tobacco: Never Used  . Alcohol use No    Review of Systems Patient denies headaches, rhinorrhea, blurry vision, numbness, shortness of breath, chest pain, edema, cough, abdominal pain, nausea, vomiting, diarrhea, dysuria, fevers, rashes or hallucinations unless otherwise stated above in HPI. ____________________________________________   PHYSICAL EXAM:  VITAL SIGNS: Vitals:   05/01/17 1745 05/01/17 1800  BP: 134/84 131/77  Pulse: 90 84  Resp: 16 11  Temp: 98 F (36.7 C)   SpO2: 100% 100%    Constitutional: Alert and oriented. Well appearing and in no acute distress. Eyes: Conjunctivae are normal.  Head: Atraumatic. Nose: No congestion/rhinnorhea. Mouth/Throat: Mucous membranes are moist.   Neck: No stridor. Painless ROM.  Cardiovascular: Normal rate, regular rhythm. Grossly normal heart sounds.  Good peripheral circulation. Respiratory: Normal respiratory effort.  No retractions. Lungs CTAB. Gastrointestinal: Soft and nontender. No distention. No abdominal bruits. No CVA  tenderness. Genitourinary:  Musculoskeletal: No lower extremity tenderness nor edema.  No joint effusions. Neurologic:  Normal speech and language. No gross focal neurologic deficits are appreciated. No facial droop Skin:  Skin is warm, dry and intact. No rash noted. Psychiatric: Mood and affect are normal. Speech and behavior are normal.  ____________________________________________   LABS (all labs ordered are listed, but only abnormal results are displayed)  Results for orders placed or performed during the hospital encounter of 05/01/17 (from the past 24 hour(s))  CBC with Differential/Platelet     Status: Abnormal   Collection Time: 05/01/17  2:24 PM  Result Value Ref Range   WBC 7.8 3.8 - 10.6 K/uL   RBC 4.73 4.40 - 5.90 MIL/uL   Hemoglobin 14.3 13.0 - 18.0 g/dL   HCT 40.5 40.0 - 52.0 %   MCV 85.7 80.0 - 100.0 fL   MCH 30.2 26.0 - 34.0 pg   MCHC 35.2 32.0 - 36.0 g/dL   RDW 13.9 11.5 - 14.5 %   Platelets 227 150 - 440 K/uL   Neutrophils Relative % 92 %   Neutro Abs 7.1 (H) 1.4 - 6.5 K/uL   Lymphocytes Relative 2 %   Lymphs Abs 0.2 (L) 1.0 - 3.6 K/uL   Monocytes Relative 6 %   Monocytes Absolute 0.5 0.2 - 1.0 K/uL   Eosinophils Relative 0 %   Eosinophils Absolute 0.0 0 - 0.7 K/uL   Basophils Relative 0 %   Basophils Absolute 0.0 0 - 0.1 K/uL  Basic metabolic panel     Status: Abnormal   Collection Time: 05/01/17  2:24 PM  Result Value Ref Range   Sodium 134 (L) 135 - 145 mmol/L   Potassium 4.0 3.5 - 5.1 mmol/L   Chloride 95 (L) 101 - 111 mmol/L   CO2 28 22 - 32 mmol/L   Glucose, Bld 98 65 - 99 mg/dL   BUN 7 6 - 20 mg/dL   Creatinine, Ser 0.65 0.61 - 1.24 mg/dL   Calcium 9.6 8.9 - 10.3 mg/dL   GFR calc non Af Amer >60 >60 mL/min   GFR calc Af Amer >60 >60 mL/min   Anion gap 11 5 - 15   ____________________________________________  EKG My review and personal interpretation at Time:  14:26   Indication: chest pain  Rate: 90  Rhythm: sinus Axis: normal Other:  normal intervals, no stemi, no depressions ____________________________________________  RADIOLOGY  I personally reviewed all radiographic images ordered to evaluate for the above acute complaints and reviewed radiology reports and findings.  These findings were personally discussed with the patient.  Please see medical record for radiology report.  ____________________________________________   PROCEDURES  Procedure(s) performed:  Procedures    Critical Care performed: no ____________________________________________   INITIAL IMPRESSION / ASSESSMENT AND PLAN / ED COURSE  Pertinent labs & imaging results that were available during my care of the patient were reviewed by me and considered in my medical decision making (see chart for details).  DDX: food bolus impaction, esophagitis, perforation, boerhaaves, mallory weiss  Aaron Walton is a 66 y.o. who presents to the ED with evidence of probable food bolus impaction by history and exam. We will  attempt IV glucagon but as he has had this in the past requiring endoscopy we'll go ahead and touch base with gastroenterology as I do suspect the patient will require endoscopy. Less consistent with perforation. No evidence of pneumomediastinum on his chest x-ray.  Clinical Course as of May 01 1813  Wed May 01, 2017  1502 I spoke with Dr. Marius Ditch of GI who agrees to take patient to endo sweet for further evaluation and management.  [PR]    Clinical Course User Index [PR] Merlyn Lot, MD     ____________________________________________   FINAL CLINICAL IMPRESSION(S) / ED DIAGNOSES  Final diagnoses:  Food impaction of esophagus, initial encounter      NEW MEDICATIONS STARTED DURING THIS VISIT:  Current Discharge Medication List       Note:  This document was prepared using Dragon voice recognition software and may include unintentional dictation errors.    Merlyn Lot, MD 05/01/17 [phone]

## 2017-05-01 NOTE — Transfer of Care (Signed)
Immediate Anesthesia Transfer of Care Note  Patient: Aaron Walton  Procedure(s) Performed: Procedure(s): ESOPHAGOGASTRODUODENOSCOPY (EGD) (N/A)  Patient Location: PACU  Anesthesia Type:General  Level of Consciousness: awake and patient cooperative  Airway & Oxygen Therapy: Patient Spontanous Breathing and Patient connected to face mask oxygen  Post-op Assessment: Report given to RN and Post -op Vital signs reviewed and stable  Post vital signs: Reviewed and stable  Last Vitals:  Vitals:   05/01/17 1545 05/01/17 1745  BP: (!) 147/89 134/84  Pulse: 77 90  Resp: 18 16  Temp: 37.1 C 36.7 C  SpO2: 100% 100%    Last Pain:  Vitals:   05/01/17 1545  TempSrc: Tympanic  PainSc: 7          Complications: No apparent anesthesia complications

## 2017-05-01 NOTE — Progress Notes (Signed)
Patient taken down to CT with orderly and RN for scan of chest at 2000. Returned to unit at this time.

## 2017-05-01 NOTE — Consult Note (Signed)
[name], MD [address]  [place]  [place], [place] [address]  Main: [phone]  Fax: [phone] Pager: [phone]   Consultation  Referring Provider:     No ref. provider found Primary Care Physician:  System, Pcp Not In Primary Gastroenterologist: None         Reason for Consultation:     Food impaction  Date of Admission:  05/01/2017 Date of Consultation:  05/01/2017         HPI:   Aaron Walton is a 66 y.o. male with history of chronic GERD, on PPI as outpatient presents with food impaction, after eating ham for lunch. He felt like something stuck in her throat. He reports having similar episode about 2-3 years ago, underwent EGD followed by dilation. Over the last several months, he reports intermittent episodes of difficulty swallowing solids. He has been taking omeprazole 40 mg once a day. He denies having an outpatient gastroenterologist as he was feeling better. He does not smoke, denies taking NSAIDs.  GI Procedures: EGD for food impaction followed by dilatation in the past  Past Medical History:  Diagnosis Date  . Asthma   . Hypertension     History reviewed. No pertinent surgical history.  Prior to Admission medications   Medication Sig Start Date End Date Taking? Authorizing Provider  albuterol (PROVENTIL HFA;VENTOLIN HFA) 108 (90 BASE) MCG/ACT inhaler Inhale 1 puff into the lungs every 6 (six) hours as needed.    [provider]  ALPRAZolam Duanne Moron) 1 MG tablet Take 1 tablet by mouth 3 (three) times daily as needed.    [provider]  cetirizine (ZYRTEC) 10 MG tablet Take 1 tablet by mouth daily.    [provider]  Fluticasone-Salmeterol (ADVAIR) 250-50 MCG/DOSE AEPB Inhale 2 puffs into the lungs 2 (two) times daily.    [provider]  furosemide (LASIX) 20 MG tablet Take 1 tablet by mouth daily.    [provider]  omeprazole (PRILOSEC) 20 MG capsule Take 1 capsule (20 mg total) by mouth at  bedtime. 06/01/15   Lucilla Lame, MD  quinapril-hydrochlorothiazide (ACCURETIC) 20-12.5 MG per tablet Take 2 tablets by mouth daily.    [provider]  tiotropium (SPIRIVA) 18 MCG inhalation capsule Place 1 capsule into inhaler and inhale daily.    [provider]    History reviewed. No pertinent family history.   Social History  Substance Use Topics  . Smoking status: Never Smoker  . Smokeless tobacco: Never Used  . Alcohol use No    Allergies as of 05/01/2017  . (No Known Allergies)    Review of Systems:    All systems reviewed and negative except where noted in HPI.   Physical Exam:  Vital signs in last 24 hours: Temp:  [98.3 F (36.8 C)-98.7 F (37.1 C)] 98.7 F (37.1 C) (09/05 1545) Pulse Rate:  [77-95] 77 (09/05 1545) Resp:  [17-18] 18 (09/05 1545) BP: (133-147)/(79-98) 147/89 (09/05 1545) SpO2:  [98 %-100 %] 100 % (09/05 1545) Weight:  [173 lb (78.5 kg)] 173 lb (78.5 kg) (09/05 1545)   General:   Pleasant, cooperative in NAD Head:  Normocephalic and atraumatic. Lungs: Respirations even and unlabored. Lungs clear to auscultation bilaterally.   No wheezes, crackles, or rhonchi.  Heart:  Regular rate and rhythm;  Without murmur, clicks, rubs or gallops Abdomen:  Soft, nondistended, nontender. Normal bowel sounds. No appreciable masses or hepatomegaly.  No rebound or guarding.  Rectal:  Not  performed. Msk:  Symmetrical without gross deformities.  Extremities:  Without edema, cyanosis or clubbing. Neurologic:  Alert and oriented x3; Skin:  Intact without significant lesions or rashes. Psych:  Alert and cooperative. Normal affect.  LAB RESULTS:  Recent Labs  05/01/17 1424  WBC 7.8  HGB 14.3  HCT 40.5  PLT 227   BMET  Recent Labs  05/01/17 1424  NA 134*  K 4.0  CL 95*  CO2 28  GLUCOSE 98  BUN 7  CREATININE 0.65  CALCIUM 9.6   LFT No results for input(s): PROT, ALBUMIN, AST, ALT, ALKPHOS, BILITOT, BILIDIR, IBILI in the last 72  hours. PT/INR No results for input(s): LABPROT, INR in the last 72 hours.  STUDIES: Dg Chest Portable 1 View  Result Date: 05/01/2017 CLINICAL DATA:  Cough, throat burning after getting ham stuck in throat during lunch. CT cannot swallow. History of esophageal stretching. EXAM: PORTABLE CHEST 1 VIEW COMPARISON:  CT chest May 05, 2010 FINDINGS: Cardiac silhouette is normal in size. Mildly tortuous aorta associated with hypertension. No pleural effusion or focal consolidation. No radiopaque foreign bodies. No pneumothorax. Old RIGHT rib fractures. Moderate hiatal hernia. IMPRESSION: No acute cardiopulmonary process. Moderate hiatal hernia. Electronically Signed   By: Elon Alas M.D.   On: 05/01/2017 14:58      Impression / Plan:   Aaron Walton is a 66 y.o. y/o male with Chronic GERD, previous history of esophageal stricture status post dilation presents with food impaction.  - Performed EGD under general anesthesia - Continue PPI twice a day - May need repeat EGD with dilation subsequently   Thank you for involving me in the care of this patient.      LOS: 0 days   Sherri Sear, MD  05/01/2017, 4:43 PM   Note: This dictation was prepared with Dragon dictation along with smaller phrase technology. Any transcriptional errors that result from this process are unintentional.

## 2017-05-01 NOTE — Anesthesia Procedure Notes (Addendum)
Procedure Name: Intubation Date/Time: 05/01/2017 4:41 PM Performed by: Jonna Clark Pre-anesthesia Checklist: Patient identified, Patient being monitored, Timeout performed, Emergency Drugs available and Suction available Patient Re-evaluated:Patient Re-evaluated prior to induction Oxygen Delivery Method: Circle system utilized Preoxygenation: Pre-oxygenation with 100% oxygen Induction Type: IV induction and Rapid sequence Ventilation: Mask ventilation without difficulty Laryngoscope Size: Miller and 2 Grade View: Grade I Tube type: Oral Tube size: 7.0 mm Number of attempts: 1 Airway Equipment and Method: Stylet Placement Confirmation: ETT inserted through vocal cords under direct vision,  positive ETCO2 and breath sounds checked- equal and bilateral Secured at: 22 cm Tube secured with: Tape Dental Injury: Teeth and Oropharynx as per pre-operative assessment

## 2017-05-01 NOTE — Consult Note (Signed)
EGD post procedure note:  Fresh blood seen in oropharynx and entire esophagus. An area of clotted blood seen in mid and lower esophagus. A food bolus seen at the GE junction which was successfully removed with raptor and tripod. Stricture at he GE junction. Mucosal tear in the lower esophagus is identified. No obvious perforation is identified in the entire esophagus. Stomach and duodenum appeared normal.   Recs:  - Admit to ICU - Stat CT chest, gastrografin, oral contrast only to rule out perforation - PPI drip - Strict NPO  - Antiemetics to suppress nausea - Monitor H & H every 8hrs for next [place], MD [address]  [place]  [place], [place] [address]  Main: [phone]  Fax: [phone] Pager: [phone]

## 2017-05-01 NOTE — ED Notes (Signed)
Radiology at bedside

## 2017-05-01 NOTE — H&P (Signed)
  [name], MD [address]  [place]  [place], [place] [address]  Main: [phone]  Fax: [phone] Pager: [phone]  Primary Care Physician:  System, Pcp Not In Primary Gastroenterologist:  Dr. [name]  Pre-Procedure History & Physical: HPI:  Aaron Walton is a 66 y.o. male is here for an endoscopy.   Past Medical History:  Diagnosis Date  . Asthma   . Hypertension     History reviewed. No pertinent surgical history.  Prior to Admission medications   Medication Sig Start Date End Date Taking? Authorizing Provider  ALPRAZolam Duanne Moron) 1 MG tablet Take 1 tablet by mouth 3 (three) times daily as needed.   Yes [provider]  clonazePAM (KLONOPIN) 1 MG tablet Take 1 mg by mouth 2 (two) times daily.   Yes [provider]  Fluticasone-Salmeterol (ADVAIR) 250-50 MCG/DOSE AEPB Inhale 2 puffs into the lungs 2 (two) times daily.   Yes [provider]  omeprazole (PRILOSEC) 20 MG capsule Take 1 capsule (20 mg total) by mouth at bedtime. 06/01/15  Yes Lucilla Lame, MD  quinapril-hydrochlorothiazide (ACCURETIC) 20-12.5 MG per tablet Take 2 tablets by mouth daily.   Yes [provider]  sertraline (ZOLOFT) 50 MG tablet Take 50 mg by mouth daily.   Yes [provider]  tiotropium (SPIRIVA) 18 MCG inhalation capsule Place 1 capsule into inhaler and inhale daily.   Yes [provider]  albuterol (PROVENTIL HFA;VENTOLIN HFA) 108 (90 BASE) MCG/ACT inhaler Inhale 1 puff into the lungs every 6 (six) hours as needed.    [provider]  cetirizine (ZYRTEC) 10 MG tablet Take 1 tablet by mouth daily.    [provider]  furosemide (LASIX) 20 MG tablet Take 1 tablet by mouth daily.    [provider]    Allergies as of 05/01/2017  . (No Known Allergies)    History reviewed. No pertinent family history.  Social History   Social History  . Marital status: Married    Spouse name: N/A  .  Number of children: N/A  . Years of education: N/A   Occupational History  . Not on file.   Social History Main Topics  . Smoking status: Never Smoker  . Smokeless tobacco: Never Used  . Alcohol use No  . Drug use: No  . Sexual activity: Not on file   Other Topics Concern  . Not on file   Social History Narrative  . No narrative on file    Review of Systems: See HPI, otherwise negative ROS  Physical Exam: BP (!) 147/89   Pulse 77   Temp 98.7 F (37.1 C) (Tympanic)   Resp 18   Ht 6' (1.829 m)   Wt 173 lb (78.5 kg)   SpO2 100%   BMI 23.46 kg/m  General:   Alert,  pleasant and cooperative in NAD Head:  Normocephalic and atraumatic. Neck:  Supple; no masses or thyromegaly. Lungs:  Clear throughout to auscultation.    Heart:  Regular rate and rhythm. Abdomen:  Soft, nontender and nondistended. Normal bowel sounds, without guarding, and without rebound.   Neurologic:  Alert and  oriented x4;  grossly normal neurologically.  Impression/Plan: Aaron Walton is here for an endoscopy to be performed for food impaction  Risks, benefits, limitations, and alternatives regarding  endoscopy have been reviewed with the patient.  Questions have been answered.  All parties agreeable.   Sherri Sear, MD  05/01/2017, 4:22 PM

## 2017-05-02 ENCOUNTER — Encounter: Payer: Self-pay | Admitting: Gastroenterology

## 2017-05-02 LAB — BASIC METABOLIC PANEL
ANION GAP: 6 (ref 5–15)
BUN: 10 mg/dL (ref 6–20)
CALCIUM: 8.4 mg/dL — AB (ref 8.9–10.3)
CO2: 24 mmol/L (ref 22–32)
Chloride: 103 mmol/L (ref 101–111)
Creatinine, Ser: 0.52 mg/dL — ABNORMAL LOW (ref 0.61–1.24)
GFR calc non Af Amer: >60 mL/min (ref >60)
Glucose, Bld: 129 mg/dL — ABNORMAL HIGH (ref 65–99)
POTASSIUM: 4 mmol/L (ref 3.5–5.1)
Sodium: 133 mmol/L — ABNORMAL LOW (ref 135–145)

## 2017-05-02 LAB — CBC
HEMATOCRIT: 38.1 % — AB (ref 40.0–52.0)
HEMOGLOBIN: 13.2 g/dL (ref 13.0–18.0)
MCH: 30.6 pg (ref 26.0–34.0)
MCHC: 34.7 g/dL (ref 32.0–36.0)
MCV: 88.1 fL (ref 80.0–100.0)
Platelets: 173 10*3/uL (ref 150–440)
RBC: 4.33 MIL/uL — AB (ref 4.40–5.90)
RDW: 13.7 % (ref 11.5–14.5)
WBC: 8.8 10*3/uL (ref 3.8–10.6)

## 2017-05-02 MED ORDER — ALBUTEROL SULFATE (2.5 MG/3ML) 0.083% IN NEBU: 3.0000 mL | INHALATION_SOLUTION | Freq: Four times a day (QID) | RESPIRATORY_TRACT | Status: DC | PRN

## 2017-05-02 MED ORDER — SERTRALINE HCL 50 MG PO TABS: 100.0000 mg | ORAL_TABLET | Freq: Every day | ORAL | Status: DC

## 2017-05-02 MED ORDER — SALINE SPRAY 0.65 % NA SOLN
1.0000 | NASAL | Status: DC | PRN
  Administered 2017-05-03 (×2): 1 via NASAL
  Filled 2017-05-02: qty 44

## 2017-05-02 MED ORDER — SERTRALINE HCL 50 MG PO TABS
100.0000 mg | ORAL_TABLET | Freq: Every day | ORAL | Status: DC
  Administered 2017-05-02: 100 mg via ORAL

## 2017-05-02 MED ORDER — TIOTROPIUM BROMIDE MONOHYDRATE 18 MCG IN CAPS
1.0000 | ORAL_CAPSULE | Freq: Every day | RESPIRATORY_TRACT | Status: DC
  Administered 2017-05-02: 18 ug via RESPIRATORY_TRACT
  Filled 2017-05-02: qty 5

## 2017-05-02 MED ORDER — MOMETASONE FURO-FORMOTEROL FUM 100-5 MCG/ACT IN AERO
2.0000 | INHALATION_SPRAY | Freq: Two times a day (BID) | RESPIRATORY_TRACT | Status: DC
  Administered 2017-05-02 (×2): 2 via RESPIRATORY_TRACT
  Filled 2017-05-02: qty 8.8

## 2017-05-02 MED ORDER — SUCRALFATE 1 GM/10ML PO SUSP
1.0000 g | Freq: Three times a day (TID) | ORAL | Status: DC
  Administered 2017-05-02 (×3): 1 g via ORAL
  Filled 2017-05-02 (×3): qty 10

## 2017-05-02 MED ORDER — CLONAZEPAM 0.5 MG PO TABS
1.0000 mg | ORAL_TABLET | Freq: Three times a day (TID) | ORAL | Status: DC | PRN
  Administered 2017-05-02 (×2): 1 mg via ORAL
  Filled 2017-05-02: qty 2
  Filled 2017-05-02: qty 1

## 2017-05-02 NOTE — Progress Notes (Signed)
Patient ID: PATTY LOPEZGARCIA, male   DOB: 07-Apr-1951, 66 y.o.   MRN: [id number]  [facility] PROGRESS NOTE  BERNHARD KOSKINEN WFU:[id number] DOB: [birth year] DOA: 05/01/2017 PCP: System, Pcp Not In  HPI/Subjective: Patient feels okay and offers no complaints.  Yesterday had a piece of ham taken out of his esophagus.  Objective: Vitals:   05/02/17 1300 05/02/17 1400  BP: 132/90 (!) 109/98  Pulse: 83 77  Resp: 18 18  Temp:    SpO2: (!) 79% 100%    Filed Weights   05/01/17 1358 05/01/17 1545 05/01/17 1900  Weight: 78.5 kg (173 lb) 78.5 kg (173 lb) 82.6 kg (182 lb 1.6 oz)    ROS: Review of Systems  Constitutional: Negative for chills and fever.  Eyes: Negative for blurred vision.  Respiratory: Negative for cough and shortness of breath.   Cardiovascular: Negative for chest pain.  Gastrointestinal: Negative for abdominal pain, constipation, diarrhea, nausea and vomiting.  Genitourinary: Negative for dysuria.  Musculoskeletal: Negative for joint pain.  Neurological: Negative for dizziness and headaches.   Exam: Physical Exam  HENT:  Nose: No mucosal edema.  Mouth/Throat: No oropharyngeal exudate or posterior oropharyngeal edema.  Eyes: Pupils are equal, round, and reactive to light. Conjunctivae, EOM and lids are normal.  Neck: No JVD present. Carotid bruit is not present. No edema present. No thyroid mass and no thyromegaly present.  Cardiovascular: S1 normal and S2 normal.  Exam reveals no gallop.   No murmur heard. Pulses:      Dorsalis pedis pulses are 2+ on the right side, and 2+ on the left side.  Respiratory: No respiratory distress. He has no wheezes. He has no rhonchi. He has no rales.  GI: Soft. Bowel sounds are normal. There is no tenderness.  Musculoskeletal:       Right ankle: He exhibits no swelling.       Left ankle: He exhibits no swelling.  Lymphadenopathy:    He has no cervical adenopathy.  Neurological: He is alert. No cranial nerve deficit.  Skin:  Skin is warm. No rash noted. Nails show no clubbing.  Psychiatric: He has a normal mood and affect.      Data Reviewed: Basic Metabolic Panel:  Recent Labs Lab 05/01/17 1424 05/02/17 0243  NA 134* 133*  K 4.0 4.0  CL 95* 103  CO2 28 24  GLUCOSE 98 129*  BUN 7 10  CREATININE 0.65 0.52*  CALCIUM 9.6 8.4*   CBC:  Recent Labs Lab 05/01/17 1424 05/01/17 1925 05/02/17 0243  WBC 7.8  --  8.8  NEUTROABS 7.1*  --   --   HGB 14.3 14.2 13.2  HCT 40.5 41.5 38.1*  MCV 85.7  --  88.1  PLT 227  --  173    CBG:  Recent Labs Lab 05/01/17 1854  GLUCAP 90    Recent Results (from the past 240 hour(s))  MRSA PCR Screening     Status: None   Collection Time: 05/01/17  6:57 PM  Result Value Ref Range Status   MRSA by PCR NEGATIVE NEGATIVE Final    Comment:        The GeneXpert MRSA Assay (FDA approved for NASAL specimens only), is one component of a comprehensive MRSA colonization surveillance program. It is not intended to diagnose MRSA infection nor to guide or monitor treatment for MRSA infections.      Studies: Ct Chest Wo Contrast  Result Date: 05/01/2017 CLINICAL DATA:  66 year old male with history of food  impaction in the esophagus status post EGD. Clinical concern for possible esophageal perforation. EXAM: CT CHEST WITHOUT CONTRAST TECHNIQUE: Multidetector CT imaging of the chest was performed following the standard protocol without IV contrast. COMPARISON:  Chest CT 05/05/2010. FINDINGS: Cardiovascular: Heart size is normal. There is no significant pericardial fluid, thickening or pericardial calcification. There is aortic atherosclerosis, as well as atherosclerosis of the great vessels of the mediastinum and the coronary arteries, including calcified atherosclerotic plaque in the left anterior descending and right coronary arteries. Mediastinum/Nodes: Severe thickening of the esophagus. Large mixed type hiatal hernia (partially sliding and partially  paraesophageal). Oral contrast material filling the esophagus and the stomach. No definite extravasation of oral contrast material was noted to confirm suspected esophageal perforation. Several densely calcified right paratracheal, subcarinal and right hilar lymph nodes are noted, similar to prior chest CT 05/05/2010. No pathologically enlarged mediastinal or hilar lymph nodes. Please note that accurate exclusion of hilar adenopathy is limited on noncontrast CT scans. No axillary lymphadenopathy. Lungs/Pleura: Trace right pleural effusion. Dependent areas of subsegmental atelectasis and/or scarring noted in the lower lobes of the lungs bilaterally. No acute consolidative airspace disease. 6 x 8 mm (mean diameter 7 mm) nodule near the apex of the right upper lobe (axial image 32 of series 3), distal to an area of focal bronchiectasis and only slightly larger than remote prior study from 05/05/2010 (previously 5 mm), nonspecific but favored to represent a benign area of scarring. Multiple other tiny 2-3 mm pulmonary nodules scattered throughout the lungs bilaterally, unchanged in size, number and distribution, considered definitively benign. Upper Abdomen: Calcified granulomas in the spleen and liver. Musculoskeletal: There are no aggressive appearing lytic or blastic lesions noted in the visualized portions of the skeleton. IMPRESSION: 1. No definite esophageal perforation confirmed on today's examination. If there is persistent clinical concern for esophageal perforation, follow-up esophagram could be considered. 2. Large hiatal hernia which is new mixed type (partially a sliding-type hiatal hernia and partially a paraesophageal type hiatal hernia), similar to prior study from 2011. Extensive esophageal wall thickening. 3. Trace right pleural effusion. 4. Multiple tiny pulmonary nodules in the lungs bilaterally, the largest of which measures up to 7 mm, but is only slightly larger than remote prior study from 2011,  and is distal to an area of focal cylindrical bronchiectasis. This is favored to represent an area of scarring or mucoid impaction. Repeat noncontrast chest CT could be considered in 1 year to ensure the stability of this finding. 5. Aortic atherosclerosis, in addition to 2 vessel coronary artery disease. Please note that although the presence of coronary artery calcium documents the presence of coronary artery disease, the severity of this disease and any potential stenosis cannot be assessed on this non-gated CT examination. Assessment for potential risk factor modification, dietary therapy or pharmacologic therapy may be warranted, if clinically indicated. Aortic Atherosclerosis (ICD10-I70.0). Electronically Signed   By: Vinnie Langton M.D.   On: 05/01/2017 20:29   Dg Chest Portable 1 View  Result Date: 05/01/2017 CLINICAL DATA:  Cough, throat burning after getting ham stuck in throat during lunch. CT cannot swallow. History of esophageal stretching. EXAM: PORTABLE CHEST 1 VIEW COMPARISON:  CT chest May 05, 2010 FINDINGS: Cardiac silhouette is normal in size. Mildly tortuous aorta associated with hypertension. No pleural effusion or focal consolidation. No radiopaque foreign bodies. No pneumothorax. Old RIGHT rib fractures. Moderate hiatal hernia. IMPRESSION: No acute cardiopulmonary process. Moderate hiatal hernia. Electronically Signed   By: Thana Farr.D.  On: 05/01/2017 14:58    Scheduled Meds: . mometasone-formoterol  2 puff Inhalation BID  . [START ON 05/05/2017] pantoprazole  40 mg Intravenous Q12H  . sertraline  100 mg Oral Daily  . sucralfate  1 g Oral TID WC & HS  . tiotropium  1 capsule Inhalation Daily   Continuous Infusions: . pantoprozole (PROTONIX) infusion 8 mg/hr (05/02/17 1400)    Assessment/Plan:  1. Food impaction of the esophagus. Had endoscopy yesterday and took out food. Patient has an esophageal stricture.  Patient on a Protonix drip. 2. History of  hypertension antihypertensive medication on hold. 3. Anxiety depression on Zoloft and Xanax 4. History of asthma COPD on inhalers  Code Status:     Code Status Orders        Start     Ordered   05/01/17 1736  Full code  Continuous     05/01/17 1735    Code Status History    Date Active Date Inactive Code Status Order ID Comments User Context   This patient has a current code status but no historical code status.     Family Communication: as per critical care specialist Disposition Plan: likely out of  ICU today  Consultants:  Gastroenterologist  Critical care specialist  Procedures:  Upper endoscopy  Time spent: 25 minutes  [place], [facility]

## 2017-05-02 NOTE — Progress Notes (Signed)
Report called to Tiffany in 1C; pt's VSS, no pain, A&O x 4, no issues at this time, PO intake is excellent.  Pt will transport via wheelchair

## 2017-05-02 NOTE — Progress Notes (Signed)
Per Dr Marius Ditch orders daily CBCs, and order clears to advance as tolerated to mechanically soft

## 2017-05-02 NOTE — Progress Notes (Signed)
Per Dr Mortimer Fries, pls transfer patient, any med surg- no telemetry

## 2017-05-02 NOTE — Progress Notes (Signed)
Patient alert and able to make needs known. Denies pain. Able to ambulate with assistance. Pt currently NPO except having a couple ice chips until GI comes in the morning to review his CT results. Pt resting in bed, continues to receive maintenance IVF and IV Protonix.

## 2017-05-02 NOTE — Progress Notes (Signed)
Per Hinton Dyer, NP, ok to stop fluids, pt's PO intake is excellent

## 2017-05-02 NOTE — Consult Note (Signed)
[name], MD [address]  [place]  [place], [place] [address]  Main: [phone]  Fax: [phone] Pager: [phone]   Aaron Walton is being followed for food impaction Day 1 of follow up    Subjective: No acute events overnight. CT chest did not reveal esophageal perforation. He denies any complaints today Hb is stable Tolerated clears   Objective: Vital signs in last 24 hours: Vitals:   05/02/17 0300 05/02/17 0400 05/02/17 0700 05/02/17 0800  BP: 107/68 104/73 113/71 116/71  Pulse: 69 66 71 68  Resp: 10 10 11 19   Temp:    97.6 F (36.4 C)  TempSrc:    Core (Comment)  SpO2: 98% 98% 100% 100%  Weight:      Height:       Weight change:   Intake/Output Summary (Last 24 hours) at 05/02/17 5462 Last data filed at 05/02/17 0800  Gross per 24 hour  Intake           3002.5 ml  Output              440 ml  Net           2562.5 ml     Exam: Heart:: Regular rate and rhythm or S1S2 present Lungs: normal and clear to auscultation Abdomen: soft, nontender, normal bowel sounds   Lab Results: @LABTEST2 @ Micro Results: Recent Results (from the past 240 hour(s))  MRSA PCR Screening     Status: None   Collection Time: 05/01/17  6:57 PM  Result Value Ref Range Status   MRSA by PCR NEGATIVE NEGATIVE Final    Comment:        The GeneXpert MRSA Assay (FDA approved for NASAL specimens only), is one component of a comprehensive MRSA colonization surveillance program. It is not intended to diagnose MRSA infection nor to guide or monitor treatment for MRSA infections.    Studies/Results: Ct Chest Wo Contrast  Result Date: 05/01/2017 CLINICAL DATA:  66 year old male with history of food impaction in the esophagus status post EGD. Clinical concern for possible esophageal perforation. EXAM: CT CHEST WITHOUT CONTRAST TECHNIQUE: Multidetector CT imaging of the chest was performed following the standard protocol without IV contrast. COMPARISON:  Chest CT  05/05/2010. FINDINGS: Cardiovascular: Heart size is normal. There is no significant pericardial fluid, thickening or pericardial calcification. There is aortic atherosclerosis, as well as atherosclerosis of the great vessels of the mediastinum and the coronary arteries, including calcified atherosclerotic plaque in the left anterior descending and right coronary arteries. Mediastinum/Nodes: Severe thickening of the esophagus. Large mixed type hiatal hernia (partially sliding and partially paraesophageal). Oral contrast material filling the esophagus and the stomach. No definite extravasation of oral contrast material was noted to confirm suspected esophageal perforation. Several densely calcified right paratracheal, subcarinal and right hilar lymph nodes are noted, similar to prior chest CT 05/05/2010. No pathologically enlarged mediastinal or hilar lymph nodes. Please note that accurate exclusion of hilar adenopathy is limited on noncontrast CT scans. No axillary lymphadenopathy. Lungs/Pleura: Trace right pleural effusion. Dependent areas of subsegmental atelectasis and/or scarring noted in the lower lobes of the lungs bilaterally. No acute consolidative airspace disease. 6 x 8 mm (mean diameter 7 mm) nodule near the apex of the right upper lobe (axial image 32 of series 3), distal to an area of focal bronchiectasis and only slightly larger than remote prior study from 05/05/2010 (previously 5 mm), nonspecific but favored to represent a benign area of scarring. Multiple other tiny 2-3  mm pulmonary nodules scattered throughout the lungs bilaterally, unchanged in size, number and distribution, considered definitively benign. Upper Abdomen: Calcified granulomas in the spleen and liver. Musculoskeletal: There are no aggressive appearing lytic or blastic lesions noted in the visualized portions of the skeleton. IMPRESSION: 1. No definite esophageal perforation confirmed on today's examination. If there is persistent  clinical concern for esophageal perforation, follow-up esophagram could be considered. 2. Large hiatal hernia which is new mixed type (partially a sliding-type hiatal hernia and partially a paraesophageal type hiatal hernia), similar to prior study from 2011. Extensive esophageal wall thickening. 3. Trace right pleural effusion. 4. Multiple tiny pulmonary nodules in the lungs bilaterally, the largest of which measures up to 7 mm, but is only slightly larger than remote prior study from 2011, and is distal to an area of focal cylindrical bronchiectasis. This is favored to represent an area of scarring or mucoid impaction. Repeat noncontrast chest CT could be considered in 1 year to ensure the stability of this finding. 5. Aortic atherosclerosis, in addition to 2 vessel coronary artery disease. Please note that although the presence of coronary artery calcium documents the presence of coronary artery disease, the severity of this disease and any potential stenosis cannot be assessed on this non-gated CT examination. Assessment for potential risk factor modification, dietary therapy or pharmacologic therapy may be warranted, if clinically indicated. Aortic Atherosclerosis (ICD10-I70.0). Electronically Signed   By: Vinnie Langton M.D.   On: 05/01/2017 20:29   Dg Chest Portable 1 View  Result Date: 05/01/2017 CLINICAL DATA:  Cough, throat burning after getting ham stuck in throat during lunch. CT cannot swallow. History of esophageal stretching. EXAM: PORTABLE CHEST 1 VIEW COMPARISON:  CT chest May 05, 2010 FINDINGS: Cardiac silhouette is normal in size. Mildly tortuous aorta associated with hypertension. No pleural effusion or focal consolidation. No radiopaque foreign bodies. No pneumothorax. Old RIGHT rib fractures. Moderate hiatal hernia. IMPRESSION: No acute cardiopulmonary process. Moderate hiatal hernia. Electronically Signed   By: Elon Alas M.D.   On: 05/01/2017 14:58   Medications: I have  reviewed the patient's current medications. Scheduled Meds: . [START ON 05/05/2017] pantoprazole  40 mg Intravenous Q12H  . tiotropium  1 capsule Inhalation Daily   Continuous Infusions: . sodium chloride 100 mL/hr at 05/02/17 0800  . pantoprozole (PROTONIX) infusion 8 mg/hr (05/02/17 0800)   PRN Meds:.albuterol, fentaNYL (SUBLIMAZE) injection   Assessment: Active Problems:   Food impaction of esophagus   GI bleed s/p EGD on 9/5 -  Removal of food bolus, Esophageal stricture at the GE junction, esophageal mucosal tears, large hiatal hernia   Plan: - Start CLD, advance to mechanical soft only - Continue PPI drip today then switch to protonix or prilosec 40mg  BID after discharge  - Start sucralfate slurry 2 times a day - Repeat EGD in 2weeks for dilation - GI clinic follow up with Dr Marius Ditch in 4weeks    LOS: 1 day   Rohini Vanga 05/02/2017, 8:12 AM

## 2017-05-03 DIAGNOSIS — K920 Hematemesis: Secondary | ICD-10-CM

## 2017-05-03 LAB — CBC WITH DIFFERENTIAL/PLATELET
BASOS ABS: 0 10*3/uL (ref 0–0.1)
Basophils Relative: 0 %
EOS ABS: 0.1 10*3/uL (ref 0–0.7)
Eosinophils Relative: 1 %
HCT: 33.4 % — ABNORMAL LOW (ref 40.0–52.0)
HEMOGLOBIN: 11.5 g/dL — AB (ref 13.0–18.0)
LYMPHS ABS: 0.2 10*3/uL — AB (ref 1.0–3.6)
LYMPHS PCT: 5 %
MCH: 29.7 pg (ref 26.0–34.0)
MCHC: 34.3 g/dL (ref 32.0–36.0)
MCV: 86.3 fL (ref 80.0–100.0)
Monocytes Absolute: 0.5 10*3/uL (ref 0.2–1.0)
Monocytes Relative: 12 %
NEUTROS PCT: 82 %
Neutro Abs: 3.6 10*3/uL (ref 1.4–6.5)
PLATELETS: 153 10*3/uL (ref 150–440)
RBC: 3.87 MIL/uL — AB (ref 4.40–5.90)
RDW: 14 % (ref 11.5–14.5)
WBC: 4.5 10*3/uL (ref 3.8–10.6)

## 2017-05-03 LAB — IRON AND TIBC
Iron: 32 ug/dL — ABNORMAL LOW (ref 45–182)
SATURATION RATIOS: 17 % — AB (ref 17.9–39.5)
TIBC: 192 ug/dL — AB (ref 250–450)
UIBC: 160 ug/dL

## 2017-05-03 LAB — VITAMIN B12: Vitamin B-12: 441 pg/mL (ref 180–914)

## 2017-05-03 LAB — FERRITIN: FERRITIN: 43 ng/mL (ref 24–336)

## 2017-05-03 LAB — HIV ANTIBODY (ROUTINE TESTING W REFLEX): HIV Screen 4th Generation wRfx: NONREACTIVE

## 2017-05-03 MED ORDER — PANTOPRAZOLE SODIUM 40 MG PO TBEC: 40.0000 mg | DELAYED_RELEASE_TABLET | Freq: Two times a day (BID) | ORAL | Status: DC

## 2017-05-03 MED ORDER — PANTOPRAZOLE SODIUM 40 MG PO TBEC
40.0000 mg | DELAYED_RELEASE_TABLET | Freq: Two times a day (BID) | ORAL | 0 refills | Status: DC
Start: 2017-05-03 — End: 2017-05-17

## 2017-05-03 MED ORDER — SUCRALFATE 1 GM/10ML PO SUSP: 1.0000 g | Freq: Three times a day (TID) | ORAL | 0 refills | Status: DC

## 2017-05-03 NOTE — Discharge Instructions (Signed)
Eat soft diet or blended foods

## 2017-05-03 NOTE — Consult Note (Signed)
[name], MD [address]  [place]  [place], [place] [address]  Main: [phone]  Fax: [phone] Pager: [phone]   BAYLON SANTELLI is being followed for food impaction Day 2 of follow up    Subjective: Feels good, denies any complaints Denies having a bowel movement since admission Hemoglobin slightly dropped since admission   Objective: Vital signs in last 24 hours: Vitals:   05/02/17 1500 05/02/17 1600 05/02/17 1940 05/03/17 0423  BP: 122/81 109/70 123/80 111/73  Pulse: 80 66 67 70  Resp: 13 12 15 12   Temp:  97.7 F (36.5 C) 98 F (36.7 C) 97.8 F (36.6 C)  TempSrc:  Axillary Oral Oral  SpO2: 100% 99% 100% 99%  Weight:   179 lb 12.8 oz (81.6 kg)   Height:   6' (1.829 m)    Weight change: 6 lb 12.8 oz (3.084 kg)  Intake/Output Summary (Last 24 hours) at 05/03/17 3154 Last data filed at 05/03/17 0086  Gross per 24 hour  Intake             1523 ml  Output             1200 ml  Net              323 ml     Exam: Heart:: Regular rate and rhythm or S1S2 present Lungs: clear to auscultation Abdomen: soft, nontender, normal bowel sounds   Lab Results: @LABTEST2 @ Micro Results: Recent Results (from the past 240 hour(s))  MRSA PCR Screening     Status: None   Collection Time: 05/01/17  6:57 PM  Result Value Ref Range Status   MRSA by PCR NEGATIVE NEGATIVE Final    Comment:        The GeneXpert MRSA Assay (FDA approved for NASAL specimens only), is one component of a comprehensive MRSA colonization surveillance program. It is not intended to diagnose MRSA infection nor to guide or monitor treatment for MRSA infections.    Studies/Results: Ct Chest Wo Contrast  Result Date: 05/01/2017 CLINICAL DATA:  66 year old male with history of food impaction in the esophagus status post EGD. Clinical concern for possible esophageal perforation. EXAM: CT CHEST WITHOUT CONTRAST TECHNIQUE: Multidetector CT imaging of the chest was performed  following the standard protocol without IV contrast. COMPARISON:  Chest CT 05/05/2010. FINDINGS: Cardiovascular: Heart size is normal. There is no significant pericardial fluid, thickening or pericardial calcification. There is aortic atherosclerosis, as well as atherosclerosis of the great vessels of the mediastinum and the coronary arteries, including calcified atherosclerotic plaque in the left anterior descending and right coronary arteries. Mediastinum/Nodes: Severe thickening of the esophagus. Large mixed type hiatal hernia (partially sliding and partially paraesophageal). Oral contrast material filling the esophagus and the stomach. No definite extravasation of oral contrast material was noted to confirm suspected esophageal perforation. Several densely calcified right paratracheal, subcarinal and right hilar lymph nodes are noted, similar to prior chest CT 05/05/2010. No pathologically enlarged mediastinal or hilar lymph nodes. Please note that accurate exclusion of hilar adenopathy is limited on noncontrast CT scans. No axillary lymphadenopathy. Lungs/Pleura: Trace right pleural effusion. Dependent areas of subsegmental atelectasis and/or scarring noted in the lower lobes of the lungs bilaterally. No acute consolidative airspace disease. 6 x 8 mm (mean diameter 7 mm) nodule near the apex of the right upper lobe (axial image 32 of series 3), distal to an area of focal bronchiectasis and only slightly larger than remote prior study from 05/05/2010 (previously 5  mm), nonspecific but favored to represent a benign area of scarring. Multiple other tiny 2-3 mm pulmonary nodules scattered throughout the lungs bilaterally, unchanged in size, number and distribution, considered definitively benign. Upper Abdomen: Calcified granulomas in the spleen and liver. Musculoskeletal: There are no aggressive appearing lytic or blastic lesions noted in the visualized portions of the skeleton. IMPRESSION: 1. No definite  esophageal perforation confirmed on today's examination. If there is persistent clinical concern for esophageal perforation, follow-up esophagram could be considered. 2. Large hiatal hernia which is new mixed type (partially a sliding-type hiatal hernia and partially a paraesophageal type hiatal hernia), similar to prior study from 2011. Extensive esophageal wall thickening. 3. Trace right pleural effusion. 4. Multiple tiny pulmonary nodules in the lungs bilaterally, the largest of which measures up to 7 mm, but is only slightly larger than remote prior study from 2011, and is distal to an area of focal cylindrical bronchiectasis. This is favored to represent an area of scarring or mucoid impaction. Repeat noncontrast chest CT could be considered in 1 year to ensure the stability of this finding. 5. Aortic atherosclerosis, in addition to 2 vessel coronary artery disease. Please note that although the presence of coronary artery calcium documents the presence of coronary artery disease, the severity of this disease and any potential stenosis cannot be assessed on this non-gated CT examination. Assessment for potential risk factor modification, dietary therapy or pharmacologic therapy may be warranted, if clinically indicated. Aortic Atherosclerosis (ICD10-I70.0). Electronically Signed   By: Vinnie Langton M.D.   On: 05/01/2017 20:29   Dg Chest Portable 1 View  Result Date: 05/01/2017 CLINICAL DATA:  Cough, throat burning after getting ham stuck in throat during lunch. CT cannot swallow. History of esophageal stretching. EXAM: PORTABLE CHEST 1 VIEW COMPARISON:  CT chest May 05, 2010 FINDINGS: Cardiac silhouette is normal in size. Mildly tortuous aorta associated with hypertension. No pleural effusion or focal consolidation. No radiopaque foreign bodies. No pneumothorax. Old RIGHT rib fractures. Moderate hiatal hernia. IMPRESSION: No acute cardiopulmonary process. Moderate hiatal hernia. Electronically Signed    By: Elon Alas M.D.   On: 05/01/2017 14:58   Medications: I have reviewed the patient's current medications. Scheduled Meds: . mometasone-formoterol  2 puff Inhalation BID  . pantoprazole  40 mg Oral BID  . sertraline  100 mg Oral Daily  . sucralfate  1 g Oral TID WC & HS  . tiotropium  1 capsule Inhalation Daily   Continuous Infusions: PRN Meds:.albuterol, clonazePAM, fentaNYL (SUBLIMAZE) injection, sodium chloride   Assessment: Active Problems:   Food impaction of esophagus   GI bleed s/p EGD on 9/5 -  Removal of food bolus, Esophageal stricture at the GE junction, esophageal mucosal tears, large hiatal hernia  Normocytic anemia - from blood loss secondary to GI bleed and IV fluids No evidence of active GI bleed   Plan: Mechanical soft diet only Informed patient to expect melena with initial 1-2 BMs from recent GI bleed Protonix 40 mg 2 times a day at least for 3 months, then Protonix 40 mg daily Sucralfate solution 2-3 times daily Avoid NSAIDs Given mild drop in hemoglobin, has mild iron deficiency based on iron studies, recommend oral iron 325 mg daily Repeat EGD in 2weeks for dilation GI clinic follow up with Dr Marius Ditch in 4weeks    LOS: 2 days   Rohini Vanga 05/03/2017, 8:08 AM

## 2017-05-03 NOTE — Progress Notes (Signed)
Pt is being discharged today. Discharge instructions reviewed with the patient, he verified understanding. 2 paper prescriptions were given to him. His home medications were returned to him. All other belongings were packed and returned to the patient. His IV/s were removed. He will be rolled out in a wheelchair by staff.

## 2017-05-03 NOTE — Discharge Summary (Signed)
[facility] NAME: Aaron Walton    MR#:  [id number]  DATE OF BIRTH:  1951/08/15  DATE OF ADMISSION:  05/01/2017 ADMITTING PHYSICIAN: Baxter Hire, MD  DATE OF DISCHARGE: 05/03/2017 10:25 AM  PRIMARY CARE PHYSICIAN: [facility] Dr. Laural Benes    ADMISSION DIAGNOSIS:  Food impaction of esophagus, initial encounter [Y10.175Z] GI bleed [K92.2]  DISCHARGE DIAGNOSIS:  Food impaction of the esophagus Esophageal stricture  SECONDARY DIAGNOSIS:   Past Medical History:  Diagnosis Date  . Asthma   . Hypertension     HOSPITAL COURSE:   1.  Food impaction of the esophagus. Patient had endoscopy which took out food. The patient has an esophageal stricture. The patient was on Protonix drip while here. The patient will follow-up with Dr. Marius Ditch as outpatient for esophageal stretching.  Protonix and Carafate prescribed. 2. The patient was documented to have a GI bleed but hemoglobin came down secondary to vigorous IV fluid hydration. Patient will be on Protonix. 3. Relative hypotension. Hold antihypertensive medications at this time. 4. Anxiety depression on Zoloft and Xanax 5. History of asthma and COPD on inhalers  DISCHARGE CONDITIONS:   satisfactory  CONSULTS OBTAINED:  Treatment Team:  Lin Landsman, MD  DRUG ALLERGIES:  No Known Allergies  DISCHARGE MEDICATIONS:   Discharge Medication List as of 05/03/2017  9:00 AM    START taking these medications   Details  pantoprazole (PROTONIX) 40 MG tablet Take 1 tablet (40 mg total) by mouth 2 (two) times daily., Starting Fri 05/03/2017, Print    sucralfate (CARAFATE) 1 GM/10ML suspension Take 10 mLs (1 g total) by mouth 3 (three) times daily before meals., Starting Fri 05/03/2017, Print      CONTINUE these medications which have NOT CHANGED   Details  clonazePAM (KLONOPIN) 1 MG tablet Take 1 mg by mouth 3 (three) times daily as needed. , Historical Med     Fluticasone-Salmeterol (ADVAIR) 250-50 MCG/DOSE AEPB Inhale 2 puffs into the lungs 2 (two) times daily., Until Discontinued, Historical Med    sertraline (ZOLOFT) 50 MG tablet Take 50 mg by mouth daily., Historical Med    tiotropium (SPIRIVA) 18 MCG inhalation capsule Place 1 capsule into inhaler and inhale daily., Until Discontinued, Historical Med    albuterol (PROVENTIL HFA;VENTOLIN HFA) 108 (90 BASE) MCG/ACT inhaler Inhale 1 puff into the lungs every 6 (six) hours as needed., Until Discontinued, Historical Med    cetirizine (ZYRTEC) 10 MG tablet Take 1 tablet by mouth daily., Until Discontinued, Historical Med      STOP taking these medications     ALPRAZolam (XANAX) 1 MG tablet      furosemide (LASIX) 20 MG tablet      omeprazole (PRILOSEC) 20 MG capsule      quinapril-hydrochlorothiazide (ACCURETIC) 20-12.5 MG per tablet          DISCHARGE INSTRUCTIONS:   Follow-up PMD one week Follow-up gastroenterology 2 weeks  If you experience worsening of your admission symptoms, develop shortness of breath, life threatening emergency, suicidal or homicidal thoughts you must seek medical attention immediately by calling 911 or calling your MD immediately  if symptoms less severe.  You Must read complete instructions/literature along with all the possible adverse reactions/side effects for all the Medicines you take and that have been prescribed to you. Take any new Medicines after you have completely understood and accept all the possible adverse reactions/side effects.   Please note  You were cared for  by a hospitalist during your hospital stay. If you have any questions about your discharge medications or the care you received while you were in the hospital after you are discharged, you can call the unit and asked to speak with the hospitalist on call if the hospitalist that took care of you is not available. Once you are discharged, your primary care physician will handle any  further medical issues. Please note that NO REFILLS for any discharge medications will be authorized once you are discharged, as it is imperative that you return to your primary care physician (or establish a relationship with a primary care physician if you do not have one) for your aftercare needs so that they can reassess your need for medications and monitor your lab values.    Today   CHIEF COMPLAINT:   Chief Complaint  Patient presents with  . Swallowed Foreign Body    HISTORY OF PRESENT ILLNESS:  Aaron Walton  is a 66 y.o. male presented with food caught in his esophagus   VITAL SIGNS:  Blood pressure 111/73, pulse 70, temperature 97.8 F (36.6 C), temperature source Oral, resp. rate 12, height 6' (1.829 m), weight 81.6 kg (179 lb 12.8 oz), SpO2 99 %.    PHYSICAL EXAMINATION:  GENERAL:  66 y.o.-year-old patient lying in the bed with no acute distress.  EYES: Pupils equal, round, reactive to light and accommodation. No scleral icterus. Extraocular muscles intact.  HEENT: Head atraumatic, normocephalic. Oropharynx and nasopharynx clear.  NECK:  Supple, no jugular venous distention. No thyroid enlargement, no tenderness.  LUNGS: Normal breath sounds bilaterally, no wheezing, rales,rhonchi or crepitation. No use of accessory muscles of respiration.  CARDIOVASCULAR: S1, S2 normal. No murmurs, rubs, or gallops.  ABDOMEN: Soft, non-tender, non-distended. Bowel sounds present. No organomegaly or mass.  EXTREMITIES: No pedal edema, cyanosis, or clubbing.  NEUROLOGIC: Cranial nerves II through XII are intact. Muscle strength 5/5 in all extremities. Sensation intact. Gait not checked.  PSYCHIATRIC: The patient is alert and oriented x 3.  SKIN: No obvious rash, lesion, or ulcer.   DATA REVIEW:   CBC  Recent Labs Lab 05/03/17 0451  WBC 4.5  HGB 11.5*  HCT 33.4*  PLT 153    Chemistries   Recent Labs Lab 05/02/17 0243  NA 133*  K 4.0  CL 103  CO2 24  GLUCOSE 129*   BUN 10  CREATININE 0.52*  CALCIUM 8.4*     Microbiology Results  Results for orders placed or performed during the hospital encounter of 05/01/17  MRSA PCR Screening     Status: None   Collection Time: 05/01/17  6:57 PM  Result Value Ref Range Status   MRSA by PCR NEGATIVE NEGATIVE Final    Comment:        The GeneXpert MRSA Assay (FDA approved for NASAL specimens only), is one component of a comprehensive MRSA colonization surveillance program. It is not intended to diagnose MRSA infection nor to guide or monitor treatment for MRSA infections.     RADIOLOGY:  Ct Chest Wo Contrast  Result Date: 05/01/2017 CLINICAL DATA:  66 year old male with history of food impaction in the esophagus status post EGD. Clinical concern for possible esophageal perforation. EXAM: CT CHEST WITHOUT CONTRAST TECHNIQUE: Multidetector CT imaging of the chest was performed following the standard protocol without IV contrast. COMPARISON:  Chest CT 05/05/2010. FINDINGS: Cardiovascular: Heart size is normal. There is no significant pericardial fluid, thickening or pericardial calcification. There is aortic atherosclerosis, as well as atherosclerosis  of the great vessels of the mediastinum and the coronary arteries, including calcified atherosclerotic plaque in the left anterior descending and right coronary arteries. Mediastinum/Nodes: Severe thickening of the esophagus. Large mixed type hiatal hernia (partially sliding and partially paraesophageal). Oral contrast material filling the esophagus and the stomach. No definite extravasation of oral contrast material was noted to confirm suspected esophageal perforation. Several densely calcified right paratracheal, subcarinal and right hilar lymph nodes are noted, similar to prior chest CT 05/05/2010. No pathologically enlarged mediastinal or hilar lymph nodes. Please note that accurate exclusion of hilar adenopathy is limited on noncontrast CT scans. No axillary  lymphadenopathy. Lungs/Pleura: Trace right pleural effusion. Dependent areas of subsegmental atelectasis and/or scarring noted in the lower lobes of the lungs bilaterally. No acute consolidative airspace disease. 6 x 8 mm (mean diameter 7 mm) nodule near the apex of the right upper lobe (axial image 32 of series 3), distal to an area of focal bronchiectasis and only slightly larger than remote prior study from 05/05/2010 (previously 5 mm), nonspecific but favored to represent a benign area of scarring. Multiple other tiny 2-3 mm pulmonary nodules scattered throughout the lungs bilaterally, unchanged in size, number and distribution, considered definitively benign. Upper Abdomen: Calcified granulomas in the spleen and liver. Musculoskeletal: There are no aggressive appearing lytic or blastic lesions noted in the visualized portions of the skeleton. IMPRESSION: 1. No definite esophageal perforation confirmed on today's examination. If there is persistent clinical concern for esophageal perforation, follow-up esophagram could be considered. 2. Large hiatal hernia which is new mixed type (partially a sliding-type hiatal hernia and partially a paraesophageal type hiatal hernia), similar to prior study from 2011. Extensive esophageal wall thickening. 3. Trace right pleural effusion. 4. Multiple tiny pulmonary nodules in the lungs bilaterally, the largest of which measures up to 7 mm, but is only slightly larger than remote prior study from 2011, and is distal to an area of focal cylindrical bronchiectasis. This is favored to represent an area of scarring or mucoid impaction. Repeat noncontrast chest CT could be considered in 1 year to ensure the stability of this finding. 5. Aortic atherosclerosis, in addition to 2 vessel coronary artery disease. Please note that although the presence of coronary artery calcium documents the presence of coronary artery disease, the severity of this disease and any potential stenosis  cannot be assessed on this non-gated CT examination. Assessment for potential risk factor modification, dietary therapy or pharmacologic therapy may be warranted, if clinically indicated. Aortic Atherosclerosis (ICD10-I70.0). Electronically Signed   By: Vinnie Langton M.D.   On: 05/01/2017 20:29   Dg Chest Portable 1 View  Result Date: 05/01/2017 CLINICAL DATA:  Cough, throat burning after getting ham stuck in throat during lunch. CT cannot swallow. History of esophageal stretching. EXAM: PORTABLE CHEST 1 VIEW COMPARISON:  CT chest May 05, 2010 FINDINGS: Cardiac silhouette is normal in size. Mildly tortuous aorta associated with hypertension. No pleural effusion or focal consolidation. No radiopaque foreign bodies. No pneumothorax. Old RIGHT rib fractures. Moderate hiatal hernia. IMPRESSION: No acute cardiopulmonary process. Moderate hiatal hernia. Electronically Signed   By: Elon Alas M.D.   On: 05/01/2017 14:58     Management plans discussed with the patient, family and they are in agreement.  CODE STATUS:     Code Status Orders        Start     Ordered   05/01/17 1736  Full code  Continuous     05/01/17 1735    Code Status  History    Date Active Date Inactive Code Status Order ID Comments User Context   This patient has a current code status but no historical code status.      TOTAL TIME TAKING CARE OF THIS PATIENT: 35 minutes.    [name] M.D on 05/03/2017 at 1:25 PM  Between 7am to 6pm - Pager - [phone]  After 6pm go to [email or web address] - password [email or web address]  [facility] Office  [phone]  CC: Primary care physician; [facility] Dr. Laural Benes

## 2017-05-17 ENCOUNTER — Other Ambulatory Visit: Payer: Self-pay

## 2017-05-17 ENCOUNTER — Encounter: Payer: Self-pay | Admitting: Gastroenterology

## 2017-05-17 ENCOUNTER — Ambulatory Visit (INDEPENDENT_AMBULATORY_CARE_PROVIDER_SITE_OTHER): Payer: Medicare Other | Admitting: Gastroenterology

## 2017-05-17 ENCOUNTER — Telehealth: Payer: Self-pay

## 2017-05-17 VITALS — BP 153/91 | Temp 98.9°F | Ht 72.0 in | Wt 182.4 lb

## 2017-05-17 DIAGNOSIS — R1310 Dysphagia, unspecified: Secondary | ICD-10-CM

## 2017-05-17 DIAGNOSIS — I8500 Esophageal varices without bleeding: Secondary | ICD-10-CM

## 2017-05-17 DIAGNOSIS — K222 Esophageal obstruction: Secondary | ICD-10-CM | POA: Diagnosis not present

## 2017-05-17 DIAGNOSIS — R1319 Other dysphagia: Secondary | ICD-10-CM

## 2017-05-17 DIAGNOSIS — T18128D Food in esophagus causing other injury, subsequent encounter: Secondary | ICD-10-CM

## 2017-05-17 MED ORDER — PANTOPRAZOLE SODIUM 40 MG PO TBEC: 40.0000 mg | DELAYED_RELEASE_TABLET | Freq: Two times a day (BID) | ORAL | 2 refills | Status: DC

## 2017-05-17 NOTE — Telephone Encounter (Signed)
EGD is supposed to be [facility] not [facility] pt called back to say that he does prefer [facility] because his sister is there and will be able to accompany him to EGD on 05/21/17.  [facility] has been canceled.  Thanks [employer]

## 2017-05-17 NOTE — Progress Notes (Signed)
[name], MD [address]  [place]  [place], [place] [address]  Main: [phone]  Fax: [phone]    Gastroenterology Consultation  Referring Provider:     No ref. provider found Primary Care Physician:  [name], MD Primary Gastroenterologist:  Dr. [name] Reason for Consultation:    Dysphagia        HPI:   Aaron Walton is a 66 y.o. male referred by Dr. [name], MD  for consultation & management of Dysphagia. He has history of primary nasal cavity cancer, underwent radiation. He recently presented to the ER with food impaction on 05/01/2017. 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  He had mild normocytic anemia secondary to blood loss from GI bleed. He reports that he had 2 dark stools followed by regular brown bowel movements. He was discharged home on sucralfate and Protonix 40mg  twice a day. He reports that he has been doing well, eating soft diet, has been very careful eating steak. He finished sucralfate, but taking protonix 40mg  daily.     GI Procedures: EGD 05/01/17 Removal of food bolus, Esophageal stricture at the GE junction, esophageal mucosal tears, large hiatal hernia  Past Medical History:  Diagnosis Date  . Asthma   . Hypertension     Past Surgical History:  Procedure Laterality Date  . ESOPHAGOGASTRODUODENOSCOPY N/A 05/01/2017   Procedure: ESOPHAGOGASTRODUODENOSCOPY (EGD);  Surgeon: Lin Landsman, MD;  Location: [facility] ENDOSCOPY;  Service: Gastroenterology;  Laterality: N/A;    Prior to Admission medications   Medication Sig Start Date End Date Taking? Authorizing Provider  albuterol (PROVENTIL HFA;VENTOLIN HFA) 108 (90 BASE) MCG/ACT inhaler Inhale 1 puff into the lungs every 6 (six) hours as needed.    [provider]  Durwin Reges 5-2.5-18.5 injection inject 0.5  milliliter intramuscularly 03/09/17   [provider]  clonazePAM (KLONOPIN) 1 MG tablet Take 1 mg by mouth 3 (three) times daily as needed.     [provider]  D3-50 50000 units capsule Take 50,000 Units by mouth once a week. 02/15/17   [provider]  fluticasone (FLONASE) 50 MCG/ACT nasal spray 1 spray by Each Nare route Two (2) times a day. 07/05/15   [provider]  Fluticasone-Salmeterol (ADVAIR DISKUS) 250-50 MCG/DOSE AEPB Inhale into the lungs. 10/31/15   [provider]  ibuprofen (ADVIL,MOTRIN) 200 MG tablet Take 600 mg by mouth.    [provider]  omeprazole (PRILOSEC) 40 MG capsule Take 40 mg by mouth daily. 03/30/17   [provider]  pantoprazole (PROTONIX) 40 MG tablet Take 1 tablet (40 mg total) by mouth 2 (two) times daily. 05/03/17   [name], MD  prednisoLONE acetate (PRED FORTE) 1 % ophthalmic suspension  03/31/17   [provider]  quinapril-hydrochlorothiazide (ACCURETIC) 20-12.5 MG tablet Take 1 tablet by mouth 2 (two) times daily. 03/30/17   [provider]  sertraline (ZOLOFT) 50 MG tablet Take 50 mg by mouth daily.    [provider]  silver sulfADIAZINE (SILVADENE) 1 % cream Apply to affected area twice daily 02/28/17 02/28/18  [provider]  SSD 1 % cream  03/30/17   [provider]  sucralfate (CARAFATE) 1 GM/10ML suspension Take 10 mLs (1 g total) by mouth 3 (three) times daily before meals. 05/03/17   [name], MD  tiotropium (SPIRIVA) 18 MCG inhalation capsule Place  1 capsule into inhaler and inhale daily.    [provider]    No family history on file.   Social History  Substance Use Topics  . Smoking status: Never Smoker  . Smokeless tobacco: Never Used  . Alcohol use No    Allergies as of 05/17/2017  . (No Known Allergies)    Review of Systems:    All systems reviewed and negative except where noted in HPI.   Physical Exam:  BP (!)  153/91   Temp 98.9 F (37.2 C) (Oral)   Ht 6' (1.829 m)   Wt 182 lb 6.4 oz (82.7 kg)   BMI 24.74 kg/m  No LMP for male patient.  General:   Alert,  Well-developed, well-nourished, pleasant and cooperative in NAD Head:  Normocephalic and atraumatic. Eyes:  Sclera clear, no icterus.   Conjunctiva pink. Ears:  Normal auditory acuity. Nose:  No deformity, discharge, or lesions. Mouth:  No deformity or lesions,oropharynx pink & moist. Neck:  Supple; no masses or thyromegaly. Lungs:  Respirations even and unlabored.  Clear throughout to auscultation.   No wheezes, crackles, or rhonchi. No acute distress. Heart:  Regular rate and rhythm; no murmurs, clicks, rubs, or gallops. Abdomen:  Normal bowel sounds.  No bruits.  Soft, non-tender and non-distended without masses, hepatosplenomegaly or hernias noted.  No guarding or rebound tenderness.   Rectal: Nor performed Msk:  Symmetrical without gross deformities. Good, equal movement & strength bilaterally. Pulses:  Normal pulses noted. Extremities:  No clubbing or edema.  No cyanosis. Neurologic:  Alert and oriented x3;  grossly normal neurologically. Skin:  Intact without significant lesions or rashes. No jaundice. Lymph Nodes:  No significant cervical adenopathy. Psych:  Alert and cooperative. Normal mood and affect.  Imaging Studies: No abdominal imaging  Assessment and Plan:   Aaron Walton is a 66 y.o. y/o male with Known history of esophageal stricture status post dilation in the past recently presented with food impaction secondary to worsening of esophageal stricture. Underwent EGD with removal of food bolus. Schedule EGD for dilation. I suspect, he will need serial dilations given the severity of stricture at closer intervals. Increase Protonix to 40 mg 2 times daily at least for 3 months. Avoid eating hard foods. From his chart review, it appears that his last colonoscopy was in December/2002. I will ask him if he is due for  screening/surveillance colonoscopy when I see him at the time of EGD.  Follow up the time of EGD   [name], MD

## 2017-05-20 ENCOUNTER — Encounter: Payer: Self-pay | Admitting: *Deleted

## 2017-05-21 ENCOUNTER — Ambulatory Visit: Admission: RE | Admit: 2017-05-21 | Payer: Medicare Other | Source: Ambulatory Visit | Admitting: Gastroenterology

## 2017-05-21 ENCOUNTER — Encounter
Admission: RE | Disposition: A | Discharge status: Home / Self Care | Payer: Self-pay | Source: Ambulatory Visit | Attending: Gastroenterology

## 2017-05-21 ENCOUNTER — Encounter: Payer: Self-pay | Admitting: Anesthesiology

## 2017-05-21 ENCOUNTER — Encounter: Admission: RE | Payer: Self-pay | Source: Ambulatory Visit

## 2017-05-21 ENCOUNTER — Ambulatory Visit
Admission: RE | Admit: 2017-05-21 | Discharge: 2017-05-21 | Disposition: A | Discharge status: Home / Self Care | Payer: Medicare Other | Source: Ambulatory Visit | Attending: Gastroenterology | Admitting: Gastroenterology

## 2017-05-21 ENCOUNTER — Ambulatory Visit: Payer: Medicare Other | Admitting: Anesthesiology

## 2017-05-21 DIAGNOSIS — B3781 Candidal esophagitis: Secondary | ICD-10-CM | POA: Insufficient documentation

## 2017-05-21 DIAGNOSIS — J302 Other seasonal allergic rhinitis: Secondary | ICD-10-CM | POA: Diagnosis not present

## 2017-05-21 DIAGNOSIS — J449 Chronic obstructive pulmonary disease, unspecified: Secondary | ICD-10-CM | POA: Diagnosis not present

## 2017-05-21 DIAGNOSIS — Z79899 Other long term (current) drug therapy: Secondary | ICD-10-CM | POA: Diagnosis not present

## 2017-05-21 DIAGNOSIS — I10 Essential (primary) hypertension: Secondary | ICD-10-CM | POA: Insufficient documentation

## 2017-05-21 DIAGNOSIS — R1310 Dysphagia, unspecified: Secondary | ICD-10-CM | POA: Diagnosis not present

## 2017-05-21 DIAGNOSIS — K449 Diaphragmatic hernia without obstruction or gangrene: Secondary | ICD-10-CM | POA: Insufficient documentation

## 2017-05-21 DIAGNOSIS — Z7951 Long term (current) use of inhaled steroids: Secondary | ICD-10-CM | POA: Insufficient documentation

## 2017-05-21 DIAGNOSIS — Z87891 Personal history of nicotine dependence: Secondary | ICD-10-CM | POA: Insufficient documentation

## 2017-05-21 DIAGNOSIS — K219 Gastro-esophageal reflux disease without esophagitis: Secondary | ICD-10-CM | POA: Insufficient documentation

## 2017-05-21 DIAGNOSIS — F411 Generalized anxiety disorder: Secondary | ICD-10-CM | POA: Insufficient documentation

## 2017-05-21 DIAGNOSIS — R1314 Dysphagia, pharyngoesophageal phase: Secondary | ICD-10-CM | POA: Diagnosis present

## 2017-05-21 DIAGNOSIS — K222 Esophageal obstruction: Secondary | ICD-10-CM

## 2017-05-21 DIAGNOSIS — R1319 Other dysphagia: Secondary | ICD-10-CM

## 2017-05-21 HISTORY — DX: Other seasonal allergic rhinitis: J30.2

## 2017-05-21 HISTORY — DX: Presence of dental prosthetic device (complete) (partial): Z97.2

## 2017-05-21 HISTORY — DX: Chronic obstructive pulmonary disease, unspecified: J44.9

## 2017-05-21 HISTORY — PX: ESOPHAGOGASTRODUODENOSCOPY (EGD) WITH PROPOFOL: SHX5813

## 2017-05-21 HISTORY — DX: Diaphragmatic hernia without obstruction or gangrene: K44.9

## 2017-05-21 HISTORY — DX: Generalized anxiety disorder: F41.1

## 2017-05-21 SURGERY — ESOPHAGOGASTRODUODENOSCOPY (EGD) WITH PROPOFOL
Anesthesia: Choice

## 2017-05-21 SURGERY — ESOPHAGOGASTRODUODENOSCOPY (EGD) WITH PROPOFOL
Anesthesia: General | Wound class: Clean Contaminated

## 2017-05-21 MED ORDER — PROPOFOL 10 MG/ML IV BOLUS
INTRAVENOUS | Status: DC | PRN
  Administered 2017-05-21: 80 mg via INTRAVENOUS
  Administered 2017-05-21 (×10): 20 mg via INTRAVENOUS
  Administered 2017-05-21: 30 mg via INTRAVENOUS
  Administered 2017-05-21: 20 mg via INTRAVENOUS
  Administered 2017-05-21: 30 mg via INTRAVENOUS

## 2017-05-21 MED ORDER — LACTATED RINGERS IV SOLN
10.0000 mL/h | INTRAVENOUS | Status: DC
  Administered 2017-05-21: 11:00:00 via INTRAVENOUS
  Administered 2017-05-21: 10 mL/h via INTRAVENOUS

## 2017-05-21 MED ORDER — ONDANSETRON HCL 4 MG/2ML IJ SOLN: 4.0000 mg | Freq: Once | INTRAMUSCULAR | Status: DC | PRN

## 2017-05-21 MED ORDER — SODIUM CHLORIDE 0.9 % IV SOLN: INTRAVENOUS | Status: DC

## 2017-05-21 MED ORDER — FLUCONAZOLE 200 MG PO TABS: 200.0000 mg | ORAL_TABLET | Freq: Every day | ORAL | 0 refills | Status: AC

## 2017-05-21 MED ORDER — ACETAMINOPHEN 160 MG/5ML PO SOLN: 325.0000 mg | ORAL | Status: DC | PRN

## 2017-05-21 MED ORDER — LIDOCAINE HCL (CARDIAC) 20 MG/ML IV SOLN
INTRAVENOUS | Status: DC | PRN
  Administered 2017-05-21: 40 mg via INTRAVENOUS

## 2017-05-21 MED ORDER — ACETAMINOPHEN 325 MG PO TABS: 325.0000 mg | ORAL_TABLET | ORAL | Status: DC | PRN

## 2017-05-21 MED ORDER — GLYCOPYRROLATE 0.2 MG/ML IJ SOLN
INTRAMUSCULAR | Status: DC | PRN
  Administered 2017-05-21: 0.2 mg via INTRAVENOUS

## 2017-05-21 SURGICAL SUPPLY — 34 items
BALLN DILATOR 10-12 8 (BALLOONS)
BALLN DILATOR 12-15 8 (BALLOONS) ×3
BALLN DILATOR 15-18 8 (BALLOONS)
BALLN DILATOR CRE 0-12 8 (BALLOONS)
BALLN DILATOR ESOPH 8 10 CRE (MISCELLANEOUS) IMPLANT
BALLOON DILATOR 12-15 8 (BALLOONS) ×1 IMPLANT
BALLOON DILATOR 15-18 8 (BALLOONS) IMPLANT
BALLOON DILATOR CRE 0-12 8 (BALLOONS) IMPLANT
BLOCK BITE 60FR ADLT L/F GRN (MISCELLANEOUS) ×3 IMPLANT
BRUSH CYTO GASTROSCOPE 3.0 (MISCELLANEOUS) ×2 IMPLANT
BRUSH CYTO GASTROSCOPE 3.0MM (MISCELLANEOUS) ×1
CANISTER SUCT 1200ML W/VALVE (MISCELLANEOUS) ×3 IMPLANT
CLIP HMST 235XBRD CATH ROT (MISCELLANEOUS) IMPLANT
CLIP RESOLUTION 360 11X235 (MISCELLANEOUS)
FCP ESCP3.2XJMB 240X2.8X (MISCELLANEOUS)
FORCEPS BIOP RAD 4 LRG CAP 4 (CUTTING FORCEPS) IMPLANT
FORCEPS BIOP RJ4 240 W/NDL (MISCELLANEOUS)
FORCEPS ESCP3.2XJMB 240X2.8X (MISCELLANEOUS) IMPLANT
GOWN CVR UNV OPN BCK APRN NK (MISCELLANEOUS) ×2 IMPLANT
GOWN ISOL THUMB LOOP REG UNIV (MISCELLANEOUS) ×4
INJECTOR VARIJECT VIN23 (MISCELLANEOUS) IMPLANT
KIT DEFENDO VALVE AND CONN (KITS) IMPLANT
KIT ENDO PROCEDURE OLY (KITS) ×3 IMPLANT
MARKER SPOT ENDO TATTOO 5ML (MISCELLANEOUS) IMPLANT
PAD GROUND ADULT SPLIT (MISCELLANEOUS) IMPLANT
RETRIEVER NET PLAT FOOD (MISCELLANEOUS) IMPLANT
SNARE SHORT THROW 13M SML OVAL (MISCELLANEOUS) IMPLANT
SNARE SHORT THROW 30M LRG OVAL (MISCELLANEOUS) IMPLANT
SPOT EX ENDOSCOPIC TATTOO (MISCELLANEOUS)
SYR INFLATION 60ML (SYRINGE) ×3 IMPLANT
TRAP ETRAP POLY (MISCELLANEOUS) IMPLANT
VARIJECT INJECTOR VIN23 (MISCELLANEOUS)
WATER STERILE IRR 250ML POUR (IV SOLUTION) ×3 IMPLANT
WIRE CRE 18-20MM 8CM F G (MISCELLANEOUS) IMPLANT

## 2017-05-21 NOTE — Anesthesia Preprocedure Evaluation (Addendum)
Anesthesia Evaluation  Patient identified by MRN, date of birth, ID band Patient awake    Reviewed: Allergy & Precautions, NPO status   Airway Mallampati: II  TM Distance: >3 FB     Dental  (+) Upper Dentures, Partial Lower   Pulmonary COPD, former smoker,    breath sounds clear to auscultation       Cardiovascular hypertension,  Rhythm:Regular Rate:Normal     Neuro/Psych Anxiety    GI/Hepatic hiatal hernia, GERD  ,  Endo/Other    Renal/GU      Musculoskeletal   Abdominal   Peds  Hematology   Anesthesia Other Findings   Reproductive/Obstetrics                            Anesthesia Physical Anesthesia Plan  ASA: II  Anesthesia Plan: General   Post-op Pain Management:    Induction: Intravenous  PONV Risk Score and Plan:   Airway Management Planned: Natural Airway and Nasal Cannula  Additional Equipment:   Intra-op Plan:   Post-operative Plan:   Informed Consent: I have reviewed the patients History and Physical, chart, labs and discussed the procedure including the risks, benefits and alternatives for the proposed anesthesia with the patient or authorized representative who has indicated his/her understanding and acceptance.     Plan Discussed with: CRNA  Anesthesia Plan Comments:        Anesthesia Quick Evaluation

## 2017-05-21 NOTE — Transfer of Care (Signed)
Immediate Anesthesia Transfer of Care Note  Patient: Aaron Walton  Procedure(s) Performed: Procedure(s): ESOPHAGOGASTRODUODENOSCOPY (EGD) WITH PROPOFOL AND DILATION (N/A)  Patient Location: PACU  Anesthesia Type: General  Level of Consciousness: awake, alert  and patient cooperative  Airway and Oxygen Therapy: Patient Spontanous Breathing and Patient connected to supplemental oxygen  Post-op Assessment: Post-op Vital signs reviewed, Patient's Cardiovascular Status Stable, Respiratory Function Stable, Patent Airway and No signs of Nausea or vomiting  Post-op Vital Signs: Reviewed and stable  Complications: No apparent anesthesia complications

## 2017-05-21 NOTE — Anesthesia Postprocedure Evaluation (Signed)
Anesthesia Post Note  Patient: Aaron Walton  Procedure(s) Performed: Procedure(s) (LRB): ESOPHAGOGASTRODUODENOSCOPY (EGD) WITH PROPOFOL AND DILATION (N/A)  Patient location during evaluation: PACU Anesthesia Type: General Level of consciousness: awake Pain management: pain level controlled Vital Signs Assessment: post-procedure vital signs reviewed and stable Respiratory status: respiratory function stable Cardiovascular status: stable Postop Assessment: no signs of nausea or vomiting Anesthetic complications: no    Veda Canning

## 2017-05-21 NOTE — Anesthesia Procedure Notes (Signed)
Performed by: Dellamae Rosamilia Pre-anesthesia Checklist: Patient identified, Emergency Drugs available, Suction available, Timeout performed and Patient being monitored Patient Re-evaluated:Patient Re-evaluated prior to induction Oxygen Delivery Method: Nasal cannula Placement Confirmation: positive ETCO2       

## 2017-05-21 NOTE — H&P (Signed)
[name], MD [address]  [place]  [place], [name] [address]  Main: [phone]  Fax: [phone] Pager: [phone]  Primary Care Physician:  [name], MD Primary Gastroenterologist:  Dr. [name]  Pre-Procedure History & Physical: HPI:  Aaron Walton is a 66 y.o. male is here for an endoscopy.   Past Medical History:  Diagnosis Date  . Allergic rhinitis, seasonal 12/14/2014  . Anxiety, generalized 12/14/2014  . Asthma    as child  . 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 COPD (chronic obstructive pulmonary disease) (HCC)    mild  . Hypertension   . Wears dentures    full upper, partial lower    Past Surgical History:  Procedure Laterality Date  . ESOPHAGOGASTRODUODENOSCOPY N/A 05/01/2017   Procedure: ESOPHAGOGASTRODUODENOSCOPY (EGD);  Surgeon: Lin Landsman, MD;  Location: [facility] ENDOSCOPY;  Service: Gastroenterology;  Laterality: N/A;    Prior to Admission medications   Medication Sig Start Date End Date Taking? Authorizing Provider  albuterol (PROVENTIL HFA;VENTOLIN HFA) 108 (90 BASE) MCG/ACT inhaler Inhale 1 puff into the lungs every 6 (six) hours as needed.   Yes [provider]  clonazePAM (KLONOPIN) 1 MG tablet Take 1 mg by mouth 3 (three) times daily as needed.    Yes [provider]  D3-50 50000 units capsule Take 50,000 Units by mouth once a week. 02/15/17  Yes [provider]  fluticasone (FLONASE) 50 MCG/ACT nasal spray 1 spray by Each Nare route Two (2) times a day. 07/05/15  Yes [provider]  Fluticasone-Salmeterol (ADVAIR DISKUS) 250-50 MCG/DOSE AEPB Inhale into the lungs. 10/31/15  Yes [provider]  ibuprofen (ADVIL,MOTRIN) 200 MG tablet Take 600 mg by mouth.   Yes [provider]  pantoprazole (PROTONIX) 40 MG tablet Take 1 tablet (40 mg total) by mouth 2 (two) times daily. 05/17/17  Yes Remona Boom, [name], MD  sertraline (ZOLOFT) 50 MG tablet Take 50 mg by mouth daily.    Yes [provider]  silver sulfADIAZINE (SILVADENE) 1 % cream Apply to affected area twice daily 02/28/17 02/28/18 Yes [provider]  tiotropium (SPIRIVA) 18 MCG inhalation capsule Place 1 capsule into inhaler and inhale daily.   Yes [provider]  prednisoLONE acetate (PRED FORTE) 1 % ophthalmic suspension  03/31/17   [provider]  quinapril-hydrochlorothiazide (ACCURETIC) 20-12.5 MG tablet Take 1 tablet by mouth 2 (two) times daily. 03/30/17   [provider]  SSD 1 % cream  03/30/17   [provider]  sucralfate (CARAFATE) 1 GM/10ML suspension Take 10 mLs (1 g total) by mouth 3 (three) times daily before meals. Patient not taking: Reported on 05/20/2017 05/03/17   [name], MD    Allergies as of 05/20/2017  . (No Known Allergies)    History reviewed. No pertinent family history.  Social History   Social History  . Marital status: Married    Spouse name: N/A  . Number of children: N/A  . Years of education: N/A   Occupational History  . Not on file.   Social History Main Topics  . Smoking status: Former Smoker    Quit date: 1998  . Smokeless tobacco: Never Used  . Alcohol use No  . Drug use: No  . Sexual activity: Not on file   Other Topics Concern  . Not on file   Social History Narrative  . No narrative on file    Review of Systems: See HPI, otherwise negative ROS  Physical  Exam: BP 120/83   Pulse 65   Temp 98.4 F (36.9 C) (Tympanic)   Resp 16   Ht 6' (1.829 m)   Wt 176 lb (79.8 kg)   SpO2 100%   BMI 23.87 kg/m  General:   Alert,  pleasant and cooperative in NAD Head:  Normocephalic and atraumatic. Neck:  Supple; no masses or thyromegaly. Lungs:  Clear throughout to auscultation.    Heart:  Regular rate and rhythm. Abdomen:  Soft, nontender and nondistended. Normal bowel sounds, without guarding, and without rebound.   Neurologic:  Alert and  oriented x4;  grossly normal  neurologically.  Impression/Plan: [name] is here for an endoscopy to be performed for esophageal stricture  Risks, benefits, limitations, and alternatives regarding  endoscopy have been reviewed with the patient.  Questions have been answered.  All parties agreeable.   Sherri Sear, MD  05/21/2017, 9:52 AM

## 2017-05-21 NOTE — Op Note (Addendum)
[facility] Gastroenterology Patient Name: Aaron Walton Procedure Date: 05/21/2017 10:35 AM MRN: [id number] Account #: [id number] Date of Birth: 1951/08/22 Admit Type: Outpatient Age: 66 Room: [facility] OR ROOM 01 Gender: Male Note Status: Finalized Procedure:            Upper GI endoscopy Indications:          Esophageal dysphagia Providers:            Lin Landsman MD, MD Referring MD:         Meindert A. Brunetta Genera, MD (Referring MD) Medicines:            Monitored Anesthesia Care Complications:        No immediate complications. Estimated blood loss: None. Procedure:            Pre-Anesthesia Assessment:                       - Prior to the procedure, a History and Physical was                        performed, and patient medications and allergies were                        reviewed. The patient is competent. The risks and                        benefits of the procedure and the sedation options and                        risks were discussed with the patient. All questions                        were answered and informed consent was obtained.                        Patient identification and proposed procedure were                        verified by the physician, the nurse, the                        anesthesiologist, the anesthetist and the technician in                        the pre-procedure area in the procedure room. Mental                        Status Examination: alert and oriented. Airway                        Examination: normal oropharyngeal airway and neck                        mobility. Respiratory Examination: clear to                        auscultation. CV Examination: normal. Prophylactic                        Antibiotics: The patient does not require prophylactic  antibiotics. Prior Anticoagulants: The patient has                        taken no previous anticoagulant or antiplatelet agents.      ASA Grade Assessment: II - A patient with mild systemic                        disease. After reviewing the risks and benefits, the                        patient was deemed in satisfactory condition to undergo                        the procedure. The anesthesia plan was to use monitored                        anesthesia care (MAC). Immediately prior to                        administration of medications, the patient was                        re-assessed for adequacy to receive sedatives. The                        heart rate, respiratory rate, oxygen saturations, blood                        pressure, adequacy of pulmonary ventilation, and                        response to care were monitored throughout the                        procedure. The physical status of the patient was                        re-assessed after the procedure.                       After obtaining informed consent, the endoscope was                        passed under direct vision. Throughout the procedure,                        the patient's blood pressure, pulse, and oxygen                        saturations were monitored continuously. The Olympus                        190 Endoscope [id number]) was introduced through the                        mouth, and advanced to the second part of duodenum. The                        upper GI endoscopy was accomplished without difficulty.  The patient tolerated the procedure well. Findings:      The duodenal bulb, second portion of the duodenum and third portion of       the duodenum were normal.      Diffuse mildly erythematous mucosa without bleeding was found in the       gastric fundus, in the gastric body and in the gastric antrum. Biopsies       were taken with a cold forceps for Helicobacter pylori testing.      Esophagogastric landmarks were identified: the gastroesophageal junction       was found at 36 cm from the incisors.       Diffuse candidiasis was found in the entire esophagus. Cells for       cytology were obtained by brushing.      Abnormal motility was noted in the lower third of the esophagus.       Retained secretions in distal esophagus. There is a decrease in motility       of the esophageal body. The distal esophagus/lower esophageal sphincter       is open. Normal peristalsis not noted. A TTS dilator was passed through       the scope. Dilation with a 12-13.5-15 mm balloon dilator was performed       emperically to 13.5 mm. The dilation site was examined following       endoscope reinsertion and showed no change.      A medium-sized hiatal hernia was present. Impression:           - Normal duodenal bulb, second portion of the duodenum                        and third portion of the duodenum.                       - Erythematous mucosa in the gastric fundus, gastric                        body and antrum. Biopsied.                       - Esophagogastric landmarks identified.                       - Monilial esophagitis. Cells for cytology obtained.                       - Abnormal esophageal motility. Empiric dilation was                        performed. Recommendation:       - Discharge patient to home.                       - Resume previous diet today.                       - Continue present medications.                       - Await pathology results.                       - Perform routine esophageal manometry at appointment  to be scheduled, suspect achalasia. Procedure Code(s):    --- Professional ---                       [phone], Esophagogastroduodenoscopy, flexible, transoral;                        with transendoscopic balloon dilation of esophagus                        (less than 30 mm diameter)                       43239, Esophagogastroduodenoscopy, flexible, transoral;                        with biopsy, single or multiple Diagnosis Code(s):    ---  Professional ---                       K31.89, Other diseases of stomach and duodenum                       B37.81, Candidal esophagitis                       K22.4, Dyskinesia of esophagus                       R13.14, Dysphagia, pharyngoesophageal phase CPT copyright 2016 American Medical Association. All rights reserved. The codes documented in this report are preliminary and upon coder review may  be revised to meet current compliance requirements. Dr. Ulyess Mort Lin Landsman MD, MD 05/21/2017 11:17:35 AM This report has been signed electronically. Number of Addenda: 0 Note Initiated On: 05/21/2017 10:35 AM      [facility]

## 2017-05-22 ENCOUNTER — Other Ambulatory Visit: Payer: Self-pay

## 2017-05-24 ENCOUNTER — Other Ambulatory Visit: Payer: Self-pay

## 2017-05-24 DIAGNOSIS — R1310 Dysphagia, unspecified: Secondary | ICD-10-CM

## 2017-05-27 ENCOUNTER — Other Ambulatory Visit: Payer: Self-pay

## 2017-05-28 ENCOUNTER — Encounter: Payer: Self-pay | Admitting: Gastroenterology

## 2017-05-30 ENCOUNTER — Ambulatory Visit: Admit: 2017-05-30 | Payer: Medicare Other | Admitting: Gastroenterology

## 2017-05-30 SURGERY — ESOPHAGOGASTRODUODENOSCOPY (EGD) WITH PROPOFOL
Anesthesia: General

## 2017-06-05 ENCOUNTER — Ambulatory Visit (HOSPITAL_COMMUNITY)
Admission: RE | Admit: 2017-06-05 | Discharge: 2017-06-05 | Disposition: A | Discharge status: Home / Self Care | Payer: Medicare Other | Source: Ambulatory Visit | Attending: Gastroenterology | Admitting: Gastroenterology

## 2017-06-05 ENCOUNTER — Encounter (HOSPITAL_COMMUNITY): Payer: Self-pay | Admitting: Gastroenterology

## 2017-06-05 ENCOUNTER — Encounter (HOSPITAL_COMMUNITY)
Admission: RE | Disposition: A | Discharge status: Home / Self Care | Payer: Self-pay | Source: Ambulatory Visit | Attending: Gastroenterology

## 2017-06-05 DIAGNOSIS — R1310 Dysphagia, unspecified: Secondary | ICD-10-CM

## 2017-06-05 HISTORY — PX: ESOPHAGEAL MANOMETRY: SHX5429

## 2017-06-05 SURGERY — MANOMETRY, ESOPHAGUS

## 2017-06-05 MED ORDER — LIDOCAINE VISCOUS 2 % MT SOLN
OROMUCOSAL | Status: AC
  Filled 2017-06-05: qty 15

## 2017-06-05 SURGICAL SUPPLY — 2 items
FACESHIELD LNG OPTICON STERILE (SAFETY) IMPLANT
GLOVE BIO SURGEON STRL SZ8 (GLOVE) ×6 IMPLANT

## 2017-06-05 NOTE — Progress Notes (Signed)
Esophageal manometry done per protocol.  Pt. Tolerated procedure well, w/o complication.  Dr. Silverio Decamp to be notified today of study.  Laverta Baltimore, RN

## 2017-06-21 ENCOUNTER — Telehealth: Payer: Self-pay

## 2017-06-21 NOTE — Telephone Encounter (Signed)
Patient has been left a voice mail for to schedule a two week follow to discuss test results. Romero Liner will contact pt with appt date and time. Thanks [employer]

## 2017-07-05 ENCOUNTER — Telehealth: Payer: Self-pay

## 2017-07-05 ENCOUNTER — Encounter: Payer: Self-pay | Admitting: Gastroenterology

## 2017-07-05 ENCOUNTER — Encounter (INDEPENDENT_AMBULATORY_CARE_PROVIDER_SITE_OTHER): Payer: Self-pay

## 2017-07-05 ENCOUNTER — Other Ambulatory Visit: Payer: Self-pay

## 2017-07-05 ENCOUNTER — Ambulatory Visit (INDEPENDENT_AMBULATORY_CARE_PROVIDER_SITE_OTHER): Payer: Medicare Other | Admitting: Gastroenterology

## 2017-07-05 VITALS — BP 147/76 | HR 78 | Temp 97.6°F | Ht 72.0 in | Wt 176.8 lb

## 2017-07-05 DIAGNOSIS — Z9889 Other specified postprocedural states: Principal | ICD-10-CM

## 2017-07-05 DIAGNOSIS — K449 Diaphragmatic hernia without obstruction or gangrene: Secondary | ICD-10-CM

## 2017-07-05 DIAGNOSIS — B370 Candidal stomatitis: Secondary | ICD-10-CM

## 2017-07-05 DIAGNOSIS — D649 Anemia, unspecified: Secondary | ICD-10-CM

## 2017-07-05 DIAGNOSIS — Z8719 Personal history of other diseases of the digestive system: Secondary | ICD-10-CM

## 2017-07-05 MED ORDER — NYSTATIN 100000 UNIT/ML MT SUSP: 5.0000 mL | Freq: Four times a day (QID) | OROMUCOSAL | 1 refills | Status: DC

## 2017-07-05 NOTE — Progress Notes (Signed)
Aaron Darby, MD [address]  [place]  [place], [place] [address]  Main: [phone]  Fax: [phone]    Gastroenterology Consultation  Referring Provider:     [name], MD Primary Care Physician:  [name], MD Primary Gastroenterologist:  Dr. Cephas Walton Reason for Consultation:    Dysphagia        HPI:   Aaron Walton is a 66 y.o. male referred by Dr. [name], MD  for consultation & management of Dysphagia. He has history of primary nasal cavity cancer, underwent radiation. He recently presented to the ER with food impaction on 05/01/2017. 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  He had mild normocytic anemia secondary to blood loss from GI bleed. He reports that he had 2 dark stools followed by regular brown bowel movements. He was discharged home on sucralfate and Protonix 40mg  twice a day. He reports that he has been doing well, eating soft diet, has been very careful eating steak. He finished sucralfate, but taking protonix 40mg  daily.    Follow up visit 07/05/17:  He reports burning of his tongue and back of his throat. He completed 2 weeks course of fluconazole for esophageal candidiasis. He underwent esophageal manometry which was normal other than mildly elevated resting LES pressure. He no longer complaints symptoms of dysphagia. He said he had a colonoscopy about 3&1/2 yrs ago and it was normal. It done by Dr Allen Norris at [facility]. He continues to take Protonix 40 mg twice daily  GI Procedures:  EGD 09/20/2014 A benign appearing intrinsic moderate stenosis was found at the GE junction and was traversed. The TTS balloon dilator was passed with the scope. Dilation with a 12-15 mm balloon to maximum balloon sizes 15 mm dilator was performed. Localized moderate inflammation  characterized by erythema was found in the gastric antrum. Biopsies were taken. The examined duodenum was normal. Pathology: Stomach biopsy mild chronic inactive gastritis. There is no evidence of Helicobacter pylori, dysplasia or malignancy. Colonoscopy 09/20/2014 Preparation of the colon was poor The perianal and digital rectal exam were normal. A 6 mm polyp was found in the sigmoid colon. The polyp was sessile. The polyp was removed with cold snare and retrieved. 2 sessile polyps were found in the rectum, diminutive and they were removed with cold biopsy forceps. Grade 2 nonbleeding internal hemorrhoids were found during retroflexion. Pathology sigmoid colon polyp tubular adenoma, no high-grade dysplasia is identified. Rectal polyps 2 hyperplastic polyps.  EGD 05/01/17 Removal of food bolus, Esophageal stricture at the GE junction, esophageal mucosal tears, large hiatal hernia  EGD 05/21/17 The duodenal bulb, second portion of the duodenum and third portion of the duodenum were normal. Findings: Diffuse mildly erythematous mucosa without bleeding was found in the gastric fundus, in the gastric body and in the gastric antrum. Biopsies were taken with a cold forceps for Helicobacter pylori testing. Esophagogastric landmarks were identified: the gastroesophageal junction was found at 36 cm from the incisors. Diffuse candidiasis was found in the entire esophagus. Cells for cytology were obtained by brushing. Abnormal motility was noted in the lower third of the esophagus. Retained secretions in distal esophagus. There is a decrease in motility of the esophageal body. The distal esophagus/lower esophageal sphincter is open. Normal peristalsis not noted. A TTS dilator was passed through the scope. Dilation with a 12-13.5-15 mm balloon dilator was  performed emperically to 13.5 mm. The dilation site was examined following endoscope reinsertion and showed no change. A medium-sized hiatal hernia was  present.  Esophageal manometry 06/05/2017   Past Medical History:  Diagnosis Date  . Allergic rhinitis, seasonal 12/14/2014  . Anxiety, generalized 12/14/2014  . Asthma    as child  . 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 COPD (chronic obstructive pulmonary disease) (HCC)    mild  . Hypertension   . Wears dentures    full upper, partial lower    No past surgical history on file.   Current Outpatient Medications:  .  albuterol (PROVENTIL HFA;VENTOLIN HFA) 108 (90 BASE) MCG/ACT inhaler, Inhale 1 puff into the lungs every 6 (six) hours as needed., Disp: , Rfl:  .  ALL DAY ALLERGY 10 MG tablet, take 1 tablet by mouth daily AT 6:00AM, Disp: , Rfl: 0 .  clonazePAM (KLONOPIN) 1 MG tablet, Take 1 mg by mouth 3 (three) times daily as needed. , Disp: , Rfl:  .  D3-50 50000 units capsule, Take 50,000 Units by mouth once a week., Disp: , Rfl: 0 .  fluticasone (FLONASE) 50 MCG/ACT nasal spray, 1 spray by Each Nare route Two (2) times a day., Disp: , Rfl:  .  Fluticasone-Salmeterol (ADVAIR DISKUS) 250-50 MCG/DOSE AEPB, Inhale into the lungs., Disp: , Rfl:  .  furosemide (LASIX) 20 MG tablet, take 1 tablet by mouth once daily if needed, Disp: , Rfl: 0 .  ibuprofen (ADVIL,MOTRIN) 200 MG tablet, Take 600 mg by mouth., Disp: , Rfl:  .  nystatin (MYCOSTATIN) 100000 UNIT/ML suspension, Take 5 mLs (500,000 Units total) 4 (four) times daily by mouth., Disp: 60 mL, Rfl: 1 .  pantoprazole (PROTONIX) 40 MG tablet, Take 1 tablet (40 mg total) by mouth 2 (two) times daily., Disp: 60 tablet, Rfl: 2 .  prednisoLONE acetate (PRED FORTE) 1 % ophthalmic suspension, , Disp: , Rfl: 0 .  quinapril-hydrochlorothiazide (ACCURETIC) 20-12.5 MG tablet, Take 1 tablet by mouth 2 (two) times daily., Disp: , Rfl: 0 .  sertraline (ZOLOFT) 50 MG tablet, Take 50 mg by mouth daily., Disp: , Rfl:  .  SHINGRIX injection, , Disp: , Rfl: 0 .  silver sulfADIAZINE (SILVADENE) 1 % cream, Apply to affected area twice daily, Disp: , Rfl:  .   SSD 1 % cream, , Disp: , Rfl: 0 .  sucralfate (CARAFATE) 1 GM/10ML suspension, Take 10 mLs (1 g total) by mouth 3 (three) times daily before meals. (Patient not taking: Reported on 05/20/2017), Disp: 840 mL, Rfl: 0 .  tamsulosin (FLOMAX) 0.4 MG CAPS capsule, Take 0.4 mg daily by mouth., Disp: , Rfl: 0 .  tiotropium (SPIRIVA) 18 MCG inhalation capsule, Place 1 capsule into inhaler and inhale daily., Disp: , Rfl:  .  triamcinolone cream (KENALOG) 0.1 %, , Disp: , Rfl: 0 No family history on file.   Social History   Tobacco Use  . Smoking status: Former Smoker    Last attempt to quit: 1998    Years since quitting: 20.8  . Smokeless tobacco: Never Used  Substance Use Topics  . Alcohol use: No  . Drug use: No    Allergies as of 07/05/2017  . (No Known Allergies)    Review of Systems:    All systems reviewed and negative except where noted in HPI.   Physical Exam:  BP (!) 147/76   Pulse 78   Temp 97.6 F (36.4 C) (Oral)   Ht 6' (1.829 m)   [employer]  176 lb 12.8 oz (80.2 kg)   BMI 23.98 kg/m  No LMP for male patient.  General:   Alert,  Well-developed, well-nourished, pleasant and cooperative in NAD Head:  Normocephalic and atraumatic. Eyes:  Sclera clear, no icterus.   Conjunctiva pink. Ears:  Normal auditory acuity. Nose:  No deformity, discharge, or lesions. Mouth: hypertrophied papilla of the tongue and whitish exudates/spots on uvula, soft palate and posterior wall of pharynx Neck:  Supple; no masses or thyromegaly. Lungs:  Respirations even and unlabored.  Clear throughout to auscultation.   No wheezes, crackles, or rhonchi. No acute distress. Heart:  Regular rate and rhythm; no murmurs, clicks, rubs, or gallops. Abdomen:  Normal bowel sounds.  No bruits.  Soft, non-tender and non-distended without masses, hepatosplenomegaly or hernias noted.  No guarding or rebound tenderness.   Rectal: Nor performed Msk:  Symmetrical without gross deformities. Good, equal movement & strength  bilaterally. Pulses:  Normal pulses noted. Extremities:  No clubbing or edema.  No cyanosis. Neurologic:  Alert and oriented x3;  grossly normal neurologically. Skin:  Intact without significant lesions or rashes. No jaundice. Lymph Nodes:  No significant cervical adenopathy. Psych:  Alert and cooperative. Normal mood and affect.  Imaging Studies: No abdominal imaging  Assessment and Plan:   Aaron Walton is a 66 y.o. male with intermittent dysphagia to solids, known history of mild esophageal stricture status post dilation in the past presented with food impaction in  04/2017, Underwent EGD with removal of food bolus.   Dysphagia to solids: There was no obvious stricture on repeat EGD on 05/21/2017. However, due to episodes of food impaction I performed an empiric balloon dilation to 15 mm. He was also found to have severe Candida esophagitis and was treated with fluconazole for 2 weeks. As he had abnormal esophageal motility based on EGD exam, I referred him for esophageal manometry and it did not reveal any esophageal dysmotility per [facility] classification. He had mildly elevated resting LES pressure. This could be most likely in the setting of a large hiatal hernia.  - He will continue Protonix, decrease to 40 mg daily  Large hiatal hernia: Discussed with him about the possible repair Will refer him to [place] [facility]  Oropharyngeal candidiasis: Prescribed him nystatin swish and swallow  Colon cancer screening: He had a colonoscopy in 08/2014 which revealed subcentimeter tubular adenoma and preparation was poor. He was recommended to have a repeat colonoscopy in 08/2019  Follow up in 6 months   Aaron Darby, MD

## 2017-07-05 NOTE — Telephone Encounter (Signed)
Referral to [facility] has been faxed to [place] for Hiatal Hernia Repair.  Office notes, demographics CT report has been sent.  Thanks [employer]

## 2017-07-12 ENCOUNTER — Telehealth: Payer: Self-pay

## 2017-07-12 NOTE — Telephone Encounter (Signed)
Spoke with Aaron Walton at [facility]  Pt has been scheduled to see Dr. Rosendo Gros on November 26th @1 :30pm in [place].

## 2017-08-14 ENCOUNTER — Other Ambulatory Visit: Payer: Self-pay | Admitting: Gastroenterology

## 2018-05-19 ENCOUNTER — Encounter: Payer: Self-pay | Admitting: *Deleted

## 2018-05-20 ENCOUNTER — Encounter: Payer: Self-pay | Admitting: *Deleted

## 2018-05-20 ENCOUNTER — Ambulatory Visit
Admission: RE | Admit: 2018-05-20 | Discharge: 2018-05-20 | Disposition: A | Discharge status: Home / Self Care | Payer: Medicare Other | Source: Ambulatory Visit | Attending: Internal Medicine | Admitting: Internal Medicine

## 2018-05-20 ENCOUNTER — Encounter
Admission: RE | Disposition: A | Discharge status: Home / Self Care | Payer: Self-pay | Source: Ambulatory Visit | Attending: Internal Medicine

## 2018-05-20 DIAGNOSIS — Z1211 Encounter for screening for malignant neoplasm of colon: Secondary | ICD-10-CM | POA: Insufficient documentation

## 2018-05-20 DIAGNOSIS — Z539 Procedure and treatment not carried out, unspecified reason: Secondary | ICD-10-CM | POA: Insufficient documentation

## 2018-05-20 SURGERY — COLONOSCOPY WITH PROPOFOL
Anesthesia: General

## 2018-05-20 MED ORDER — SODIUM CHLORIDE 0.9 % IV SOLN: INTRAVENOUS | Status: DC

## 2018-10-31 IMAGING — CT CT CHEST W/O CM
2 of 3 series · 14 of 36 positions shown, 17 images · non-contrast
Comparison: Chest CT 05/05/2010.

CLINICAL DATA: 65-year-old male with history of food impaction in
the esophagus status post EGD. Clinical concern for possible
esophageal perforation.

EXAM:
CT CHEST WITHOUT CONTRAST
TECHNIQUE: Multidetector CT imaging of the chest was performed following the
standard protocol without IV contrast.

[Series 2: thorax · axial · 0.71mm/px · z∈[+97,+379]mm · 11 of 167 slices shown, 14 images]
[im 13/167  mediastinal]
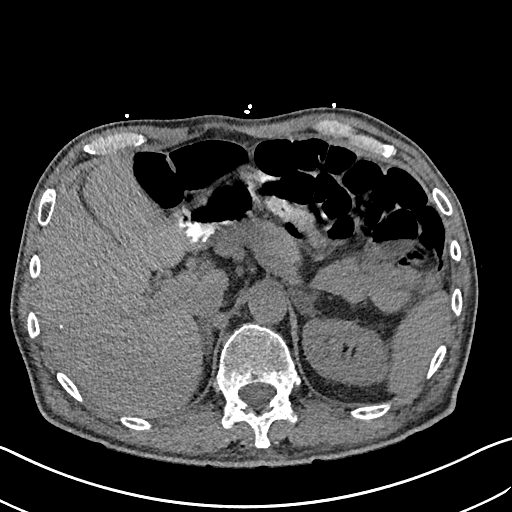
[im 13/167  lung]
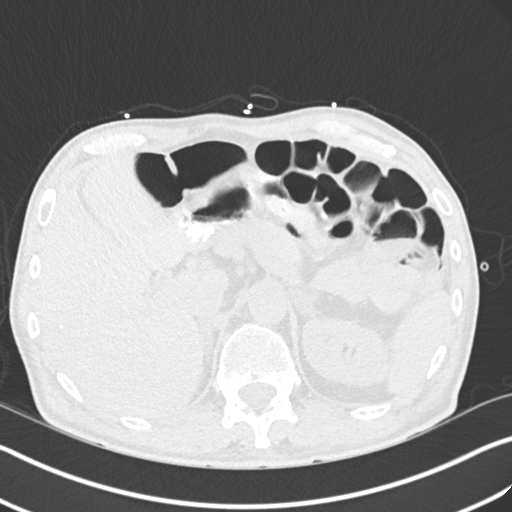
[im 25/167  lung]
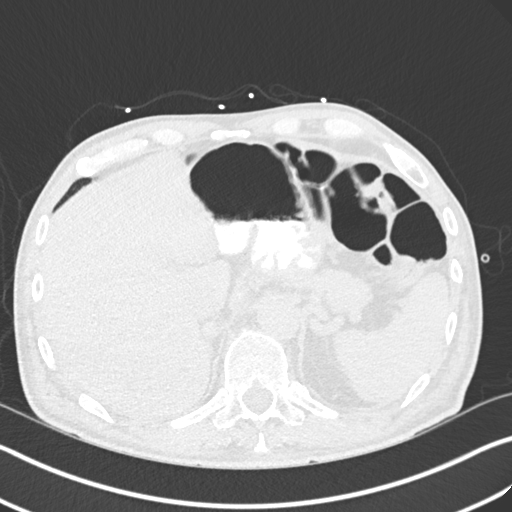
[im 37/167  lung]
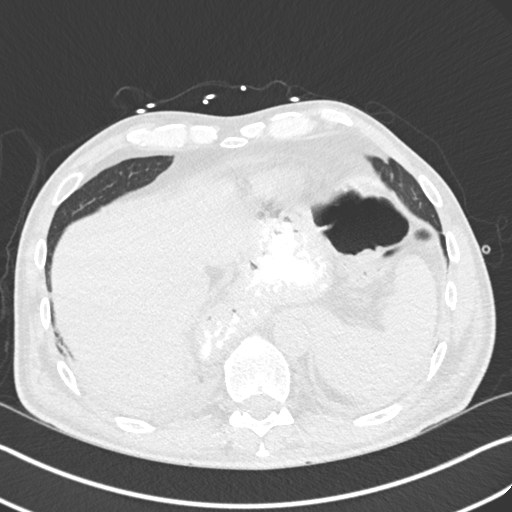
[im 56/167  lung]
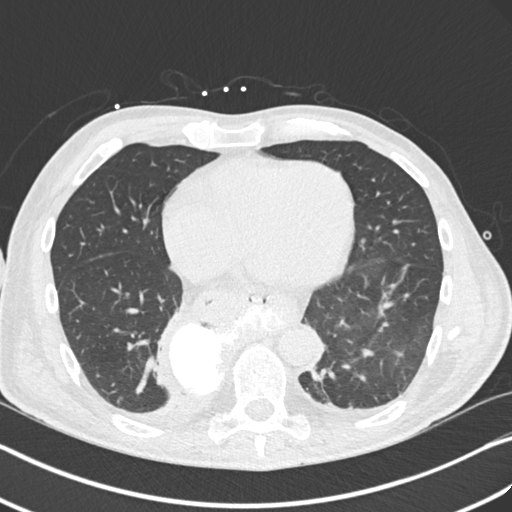
[im 68/167  mediastinal]
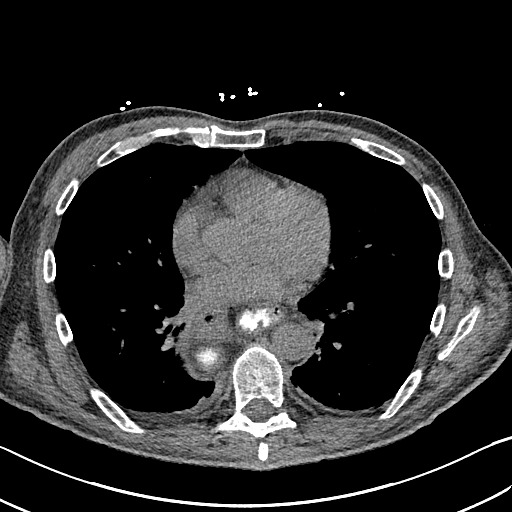
[im 68/167  lung]
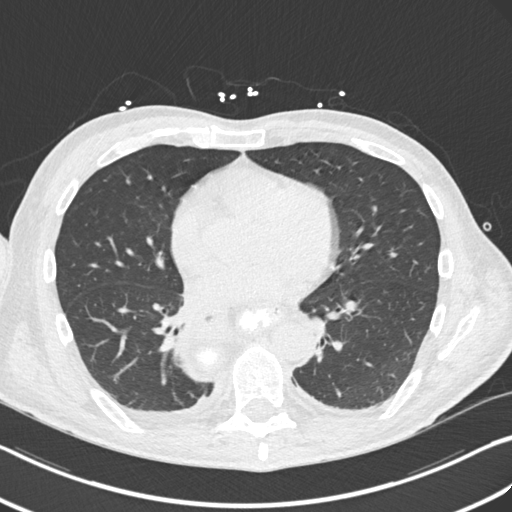
[im 87/167  lung]
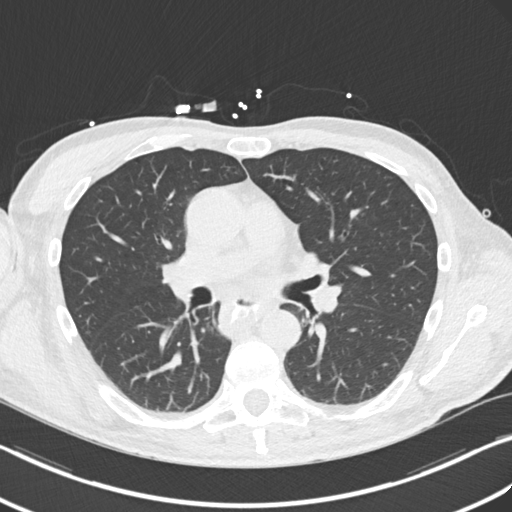
[im 99/167  lung]
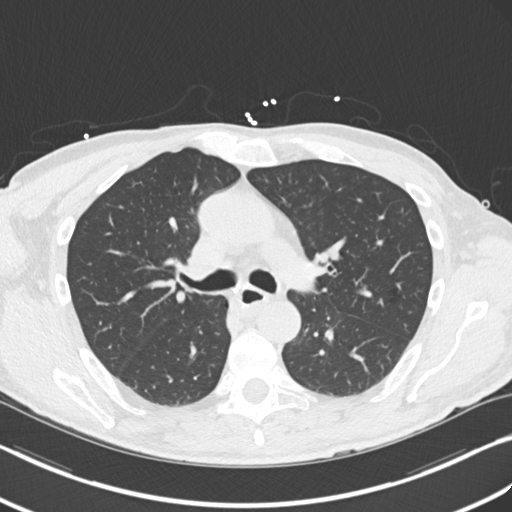
[im 111/167  lung]
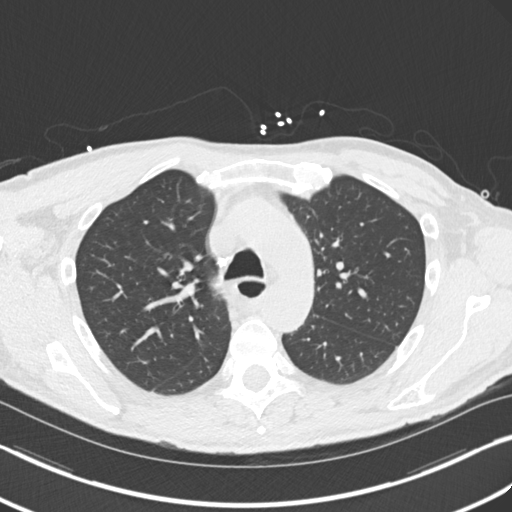
[im 130/167  mediastinal]
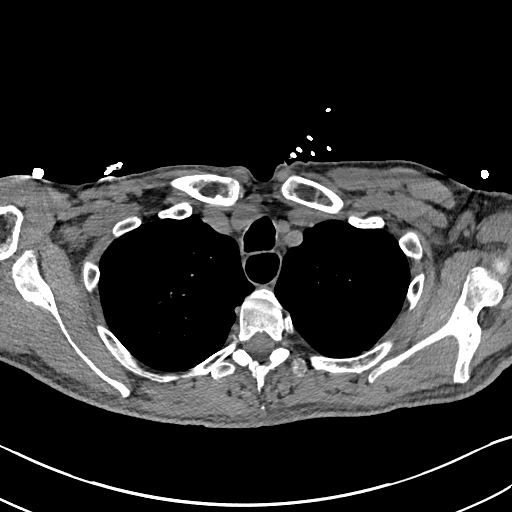
[im 130/167  lung]
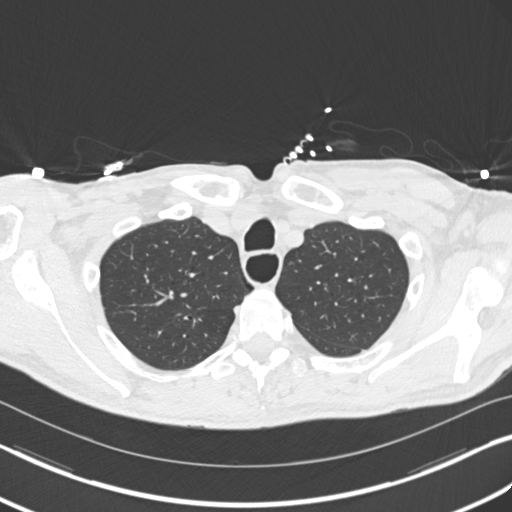
[im 142/167  lung]
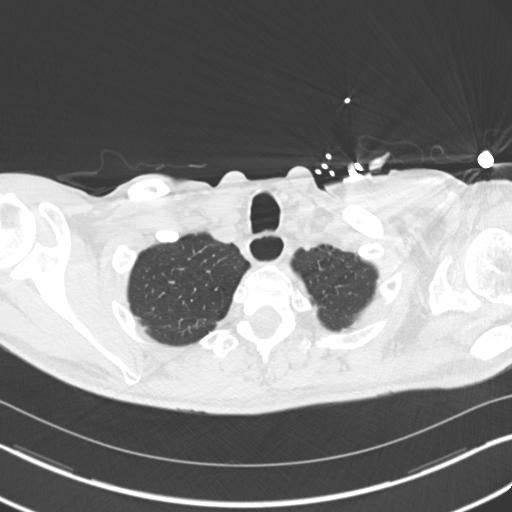
[im 154/167  lung]
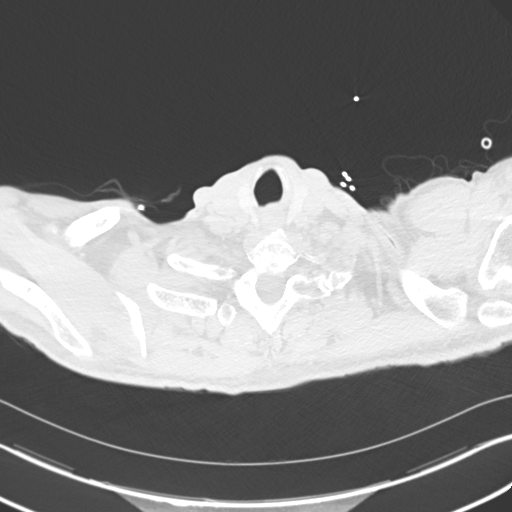

[Series 5: coronal · coronal · 0.65mm/px · 3 of 130 slices shown]
[im 26/130  lung]
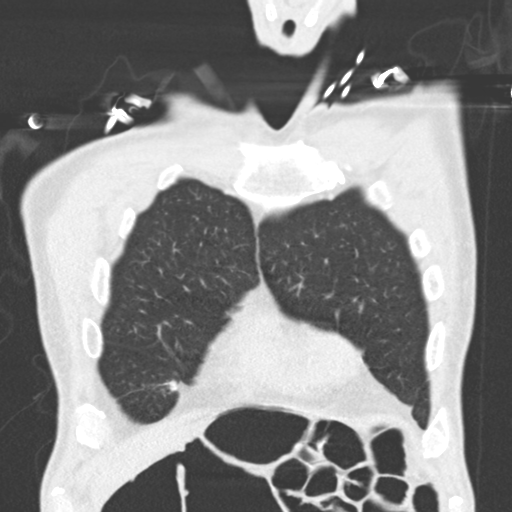
[im 52/130  lung]
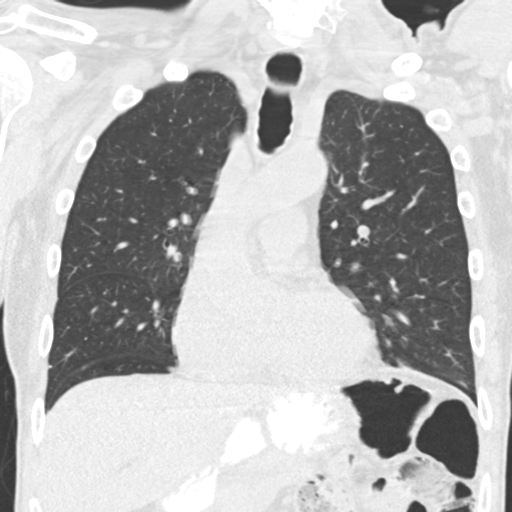
[im 78/130  lung]
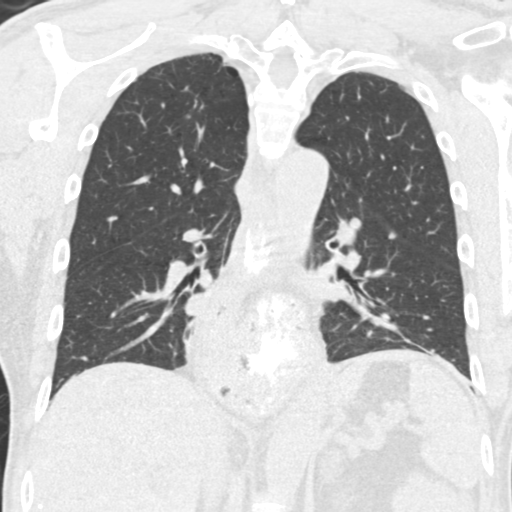

[14 of 36 positions shown; findings below may reference images not displayed]

FINDINGS: Cardiovascular: Heart size is normal. There is no significant
pericardial fluid, thickening or pericardial calcification. There is
aortic atherosclerosis, as well as atherosclerosis of the great
vessels of the mediastinum and the coronary arteries, including
calcified atherosclerotic plaque in the left anterior descending and
right coronary arteries.

Mediastinum/Nodes: Severe thickening of the esophagus. Large mixed
type hiatal hernia (partially sliding and partially paraesophageal).
Oral contrast material filling the esophagus and the stomach. No
definite extravasation of oral contrast material was noted to
confirm suspected esophageal perforation. Several densely calcified
right paratracheal, subcarinal and right hilar lymph nodes are
noted, similar to prior chest CT 05/05/2010. No pathologically
enlarged mediastinal or hilar lymph nodes. Please note that accurate
exclusion of hilar adenopathy is limited on noncontrast CT scans. No
axillary lymphadenopathy.

Lungs/Pleura: Trace right pleural effusion. Dependent areas of
subsegmental atelectasis and/or scarring noted in the lower lobes of
the lungs bilaterally. No acute consolidative airspace disease. 6 x
8 mm (mean diameter 7 mm) nodule near the apex of the right upper
lobe (axial image 32 of series 3), distal to an area of focal
bronchiectasis and only slightly larger than remote prior study from
05/05/2010 (previously 5 mm), nonspecific but favored to represent a
benign area of scarring. Multiple other tiny 2-3 mm pulmonary
nodules scattered throughout the lungs bilaterally, unchanged in
size, number and distribution, considered definitively benign.

Upper Abdomen: Calcified granulomas in the spleen and liver.

Musculoskeletal: There are no aggressive appearing lytic or blastic
lesions noted in the visualized portions of the skeleton.
IMPRESSION: 1. No definite esophageal perforation confirmed on today's
examination. If there is persistent clinical concern for esophageal
perforation, follow-up esophagram could be considered.
2. Large hiatal hernia which is new mixed type (partially a
sliding-type hiatal hernia and partially a paraesophageal type
hiatal hernia), similar to prior study from 1066. Extensive
esophageal wall thickening.
3. Trace right pleural effusion.
4. Multiple tiny pulmonary nodules in the lungs bilaterally, the
largest of which measures up to 7 mm, but is only slightly larger
than remote prior study from 1066, and is distal to an area of focal
cylindrical bronchiectasis. This is favored to represent an area of
scarring or mucoid impaction. Repeat noncontrast chest CT could be
considered in 1 year to ensure the stability of this finding.
5. Aortic atherosclerosis, in addition to 2 vessel coronary artery
disease. Please note that although the presence of coronary artery
calcium documents the presence of coronary artery disease, the
severity of this disease and any potential stenosis cannot be
assessed on this non-gated CT examination. Assessment for potential
risk factor modification, dietary therapy or pharmacologic therapy
may be warranted, if clinically indicated.

Aortic Atherosclerosis ([id number]).

## 2020-09-15 DIAGNOSIS — Z0100 Encounter for examination of eyes and vision without abnormal findings: Secondary | ICD-10-CM | POA: Diagnosis not present

## 2020-09-15 DIAGNOSIS — H524 Presbyopia: Secondary | ICD-10-CM | POA: Diagnosis not present

## 2021-02-03 DIAGNOSIS — I10 Essential (primary) hypertension: Secondary | ICD-10-CM | POA: Diagnosis not present

## 2021-02-03 DIAGNOSIS — E782 Mixed hyperlipidemia: Secondary | ICD-10-CM | POA: Diagnosis not present

## 2021-02-03 DIAGNOSIS — N400 Enlarged prostate without lower urinary tract symptoms: Secondary | ICD-10-CM | POA: Diagnosis not present

## 2021-02-03 DIAGNOSIS — F39 Unspecified mood [affective] disorder: Secondary | ICD-10-CM | POA: Diagnosis not present

## 2021-02-03 DIAGNOSIS — R739 Hyperglycemia, unspecified: Secondary | ICD-10-CM | POA: Diagnosis not present

## 2021-02-03 DIAGNOSIS — J449 Chronic obstructive pulmonary disease, unspecified: Secondary | ICD-10-CM | POA: Diagnosis not present

## 2021-05-24 DIAGNOSIS — Z8522 Personal history of malignant neoplasm of nasal cavities, middle ear, and accessory sinuses: Secondary | ICD-10-CM | POA: Diagnosis not present

## 2021-05-24 DIAGNOSIS — I10 Essential (primary) hypertension: Secondary | ICD-10-CM | POA: Diagnosis not present

## 2021-05-24 DIAGNOSIS — K219 Gastro-esophageal reflux disease without esophagitis: Secondary | ICD-10-CM | POA: Diagnosis not present

## 2021-05-24 DIAGNOSIS — Z1159 Encounter for screening for other viral diseases: Secondary | ICD-10-CM | POA: Diagnosis not present

## 2021-05-24 DIAGNOSIS — R7309 Other abnormal glucose: Secondary | ICD-10-CM | POA: Diagnosis not present

## 2021-05-24 DIAGNOSIS — J449 Chronic obstructive pulmonary disease, unspecified: Secondary | ICD-10-CM | POA: Diagnosis not present

## 2021-05-24 DIAGNOSIS — R399 Unspecified symptoms and signs involving the genitourinary system: Secondary | ICD-10-CM | POA: Diagnosis not present

## 2021-05-24 DIAGNOSIS — Z125 Encounter for screening for malignant neoplasm of prostate: Secondary | ICD-10-CM | POA: Diagnosis not present

## 2023-03-27 LAB — COLOGUARD

## 2024-03-07 ENCOUNTER — Emergency Department
Admission: EM | Admit: 2024-03-07 | Discharge: 2024-03-07 | Disposition: A | Discharge status: Home / Self Care | Attending: Emergency Medicine | Admitting: Emergency Medicine

## 2024-03-07 ENCOUNTER — Other Ambulatory Visit: Payer: Self-pay

## 2024-03-07 DIAGNOSIS — J449 Chronic obstructive pulmonary disease, unspecified: Secondary | ICD-10-CM | POA: Insufficient documentation

## 2024-03-07 DIAGNOSIS — E871 Hypo-osmolality and hyponatremia: Secondary | ICD-10-CM | POA: Insufficient documentation

## 2024-03-07 DIAGNOSIS — I10 Essential (primary) hypertension: Secondary | ICD-10-CM | POA: Diagnosis present

## 2024-03-07 DIAGNOSIS — F419 Anxiety disorder, unspecified: Secondary | ICD-10-CM | POA: Diagnosis not present

## 2024-03-07 LAB — CBC WITH DIFFERENTIAL/PLATELET
Abs Immature Granulocytes: 0.02 K/uL (ref 0.00–0.07)
Basophils Absolute: 0 K/uL (ref 0.0–0.1)
Basophils Relative: 0 %
Eosinophils Absolute: 0 K/uL (ref 0.0–0.5)
Eosinophils Relative: 1 %
HCT: 40.8 % (ref 39.0–52.0)
Hemoglobin: 14.2 g/dL (ref 13.0–17.0)
Immature Granulocytes: 0 %
Lymphocytes Relative: 7 %
Lymphs Abs: 0.4 K/uL — ABNORMAL LOW (ref 0.7–4.0)
MCH: 29.1 pg (ref 26.0–34.0)
MCHC: 34.8 g/dL (ref 30.0–36.0)
MCV: 83.6 fL (ref 80.0–100.0)
Monocytes Absolute: 0.5 K/uL (ref 0.1–1.0)
Monocytes Relative: 9 %
Neutro Abs: 4.4 K/uL (ref 1.7–7.7)
Neutrophils Relative %: 83 %
Platelets: 250 K/uL (ref 150–400)
RBC: 4.88 MIL/uL (ref 4.22–5.81)
RDW: 12.5 % (ref 11.5–15.5)
WBC: 5.4 K/uL (ref 4.0–10.5)
nRBC: 0 % (ref 0.0–0.2)

## 2024-03-07 LAB — COMPREHENSIVE METABOLIC PANEL WITH GFR
ALT: 21 U/L (ref 0–44)
AST: 23 U/L (ref 15–41)
Albumin: 3.8 g/dL (ref 3.5–5.0)
Alkaline Phosphatase: 63 U/L (ref 38–126)
Anion gap: 8 (ref 5–15)
BUN: 8 mg/dL (ref 8–23)
CO2: 25 mmol/L (ref 22–32)
Calcium: 9.2 mg/dL (ref 8.9–10.3)
Chloride: 93 mmol/L — ABNORMAL LOW (ref 98–111)
Creatinine, Ser: 0.6 mg/dL — ABNORMAL LOW (ref 0.61–1.24)
GFR, Estimated: >60 mL/min (ref >60)
Glucose, Bld: 117 mg/dL — ABNORMAL HIGH (ref 70–99)
Potassium: 3.6 mmol/L (ref 3.5–5.1)
Sodium: 126 mmol/L — ABNORMAL LOW (ref 135–145)
Total Bilirubin: 0.8 mg/dL (ref 0.0–1.2)
Total Protein: 7.1 g/dL (ref 6.5–8.1)

## 2024-03-07 LAB — TROPONIN I (HIGH SENSITIVITY): Troponin I (High Sensitivity): 5 ng/L (ref <18)

## 2024-03-07 MED ORDER — BUSPIRONE HCL 5 MG PO TABS
7.5000 mg | ORAL_TABLET | Freq: Once | ORAL | Status: AC
  Administered 2024-03-07: 7.5 mg via ORAL
  Filled 2024-03-07: qty 2

## 2024-03-07 MED ORDER — SODIUM CHLORIDE 0.9 % IV BOLUS
1000.0000 mL | Freq: Once | INTRAVENOUS | Status: AC
  Administered 2024-03-07: 1000 mL via INTRAVENOUS

## 2024-03-07 MED ORDER — BUSPIRONE HCL 7.5 MG PO TABS: 7.5000 mg | ORAL_TABLET | Freq: Two times a day (BID) | ORAL | 0 refills | Status: AC

## 2024-03-07 MED ORDER — BUSPIRONE HCL 5 MG PO TABS
15.0000 mg | ORAL_TABLET | Freq: Once | ORAL | Status: DC
Start: 2024-03-07 — End: 2024-03-07

## 2024-03-07 NOTE — ED Triage Notes (Signed)
 Pt to ED from home for HTN since last night. Takes hydrochlorothiazide for HTN, states not working. States BP was 200/105 this AM, took 3 25mg  hydrochlorothiazide this AM (usually takes 1). Pt also endorses anxiety with hx panic disorder and takes Klonopin  PRN. States he feels like he can't sit still.

## 2024-03-07 NOTE — ED Provider Notes (Signed)
 [facility] Provider Note    Event Date/Time   First MD Initiated Contact with Patient 03/07/24 [phone]     (approximate)   History   Hypertension   HPI  Aaron Walton is a 73 y.o. male  with history of COPD, hypertension, Panic/anxiety and as listed in EMR presents to the emergency department for treatment and evaluation of hypertension. He checks his blood pressure often and last night it was elevated and again this morning. 200/105. About 2 hours prior to arrival, he took 75mg  of hydrochlorothiazide instead of 25mg . He was feeling dizzy and had a slight headache. He feels his blood pressure is elevated due to anxiety and stress. His sister passed away recently and he has had to take care of the funeral arrangements and clean out her apartment. He has discussed his anxiety with primary care who prescribed hydroxyzine 20mg  which has not provided any relief.      Physical Exam    Vitals:   03/07/24 1010 03/07/24 1120  BP: (!) 168/96 (!) 165/91  Pulse: 60 62  Resp: 16 16  Temp:    SpO2: 99% 98%    General: Awake, no distress.  CV:  Good peripheral perfusion.  Resp:  Normal effort.  Abd:  No distention.  Other:     ED Results / Procedures / Treatments   Labs (all labs ordered are listed, but only abnormal results are displayed)  Labs Reviewed  COMPREHENSIVE METABOLIC PANEL WITH GFR - Abnormal; Notable for the following components:      Result Value   Sodium 126 (*)    Chloride 93 (*)    Glucose, Bld 117 (*)    Creatinine, Ser 0.60 (*)    All other components within normal limits  CBC WITH DIFFERENTIAL/PLATELET - Abnormal; Notable for the following components:   Lymphs Abs 0.4 (*)    All other components within normal limits  TROPONIN I (HIGH SENSITIVITY)     EKG  Normal sinus rhythm, rate of 71.   RADIOLOGY  Image and radiology report reviewed and interpreted by me. Radiology report consistent with the same.  Not  indicated.  PROCEDURES:  Critical Care performed: No  Procedures   MEDICATIONS ORDERED IN ED:  Medications  sodium chloride  0.9 % bolus 1,000 mL (0 mLs Intravenous Stopped 03/07/24 0949)  sodium chloride  0.9 % bolus 1,000 mL (0 mLs Intravenous Stopped 03/07/24 1137)  busPIRone  (BUSPAR ) tablet 7.5 mg (7.5 mg Oral Given 03/07/24 1014)     IMPRESSION / MDM / ASSESSMENT AND PLAN / ED COURSE   I have reviewed the triage note and vital signs. Vital signs noted to be hypertensive.   Differential diagnosis includes, but is not limited to, uncontrolled hypertension, accidental overdose of hydrochlorothiazide, anxiety/panic attack.   Patient's presentation is most consistent with acute complicated illness / injury requiring diagnostic workup.   Clinical Course as of 03/07/24 1412  Sat Mar 07, 2024  0956 Lab studies reviewed.  CBC is normal.  CMP indicates a hyponatremia of sodium of 126, chloride of 93, glucose of 117-test nonfasting-test normal BUN and creatinine.  1 L of saline confused.  Second liter of saline ordered due to degree of hyponatremia plus his 75 mg dose of hydrochlorothiazide today.  Hopefully this will prevent dehydration, worsening hyponatremia, and tachycardia.  The symptoms would likely make his anxiety worse.  BuSpar  ordered which will hopefully help decrease his anxiety and lower his blood pressure as well. [CT]    Clinical  Course User Index [CT] Ryota Treece B, FNP   Blood pressure 165/91 with a heart rate of 62.  Patient reports feeling better after taking BuSpar .  He will be discharged home with a prescription for the same.  He was advised to follow-up with his primary care provider for further treatment of his anxiety.  He states that he has an appointment in about 2 weeks.  He was advised not to take any additional hydrochlorothiazide today and to check his blood pressure in the morning before taking another dose.  He was advised that if he has any symptoms of  concern this afternoon or this evening to recheck his blood pressure and if it is significantly low to return to the emergency department.  Wife at bedside who also acknowledges these instructions.  Patient discharged in stable condition and ambulated out of the department without assistance.  FINAL CLINICAL IMPRESSION(S) / ED DIAGNOSES   Final diagnoses:  Uncontrolled hypertension  Anxiety  Hyponatremia     Rx / DC Orders   ED Discharge Orders          Ordered    busPIRone  (BUSPAR ) 7.5 MG tablet  2 times daily        03/07/24 1129             Note:  This document was prepared using Dragon voice recognition software and may include unintentional dictation errors.   Herlinda Kirk NOVAK, FNP 03/07/24 1413    Dicky Anes, MD 03/07/24 1525

## 2024-03-07 NOTE — Discharge Instructions (Signed)
 Please follow-up with your primary care provider in 2 weeks as scheduled or sooner if your symptoms do not improve or are worse.  Do not take more than 1 hydrochlorothiazide per day.  Increase your sodium intake in your foods.  Take the anxiety medication twice per day as prescribed.
